# Patient Record
Sex: Male | Born: 1980 | Race: White | Hispanic: No | Marital: Married | Smoking: Current every day smoker
Health system: Southern US, Community
[De-identification: ages and names within clinical notes are randomized; demographics above are authoritative.]

## PROBLEM LIST (undated history)

## (undated) DIAGNOSIS — R569 Unspecified convulsions: Secondary | ICD-10-CM

## (undated) DIAGNOSIS — S069XAA Unspecified intracranial injury with loss of consciousness status unknown, initial encounter: Secondary | ICD-10-CM

## (undated) HISTORY — DX: Unspecified convulsions: R56.9

## (undated) HISTORY — PX: CRANIOTOMY: SHX93

---
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
## 2001-06-03 ENCOUNTER — Emergency Department (HOSPITAL_COMMUNITY): Admission: EM | Admit: 2001-06-03 | Discharge: 2001-06-03 | Payer: Self-pay | Admitting: Emergency Medicine

## 2001-06-03 ENCOUNTER — Encounter: Payer: Self-pay | Admitting: Emergency Medicine

## 2006-11-12 ENCOUNTER — Emergency Department (HOSPITAL_COMMUNITY): Admission: EM | Admit: 2006-11-12 | Discharge: 2006-11-12 | Payer: Self-pay | Admitting: Emergency Medicine

## 2008-09-07 ENCOUNTER — Emergency Department (HOSPITAL_COMMUNITY): Admission: EM | Admit: 2008-09-07 | Discharge: 2008-09-07 | Payer: Self-pay | Admitting: Family Medicine

## 2008-10-16 ENCOUNTER — Emergency Department (HOSPITAL_COMMUNITY): Admission: EM | Admit: 2008-10-16 | Discharge: 2008-10-16 | Payer: Self-pay | Admitting: Emergency Medicine

## 2010-08-19 HISTORY — PX: HAND SURGERY: SHX662

## 2010-11-18 ENCOUNTER — Observation Stay (HOSPITAL_COMMUNITY)
Admission: EM | Admit: 2010-11-18 | Discharge: 2010-11-19 | DRG: 501 | Disposition: A | Discharge status: Home / Self Care | Payer: Self-pay | Attending: Emergency Medicine | Admitting: Emergency Medicine

## 2010-11-18 ENCOUNTER — Emergency Department (HOSPITAL_COMMUNITY): Payer: Self-pay

## 2010-11-18 DIAGNOSIS — S55809A Unspecified injury of other blood vessels at forearm level, unspecified arm, initial encounter: Secondary | ICD-10-CM | POA: Insufficient documentation

## 2010-11-18 DIAGNOSIS — S5400XA Injury of ulnar nerve at forearm level, unspecified arm, initial encounter: Secondary | ICD-10-CM | POA: Insufficient documentation

## 2010-11-18 DIAGNOSIS — S66909A Unspecified injury of unspecified muscle, fascia and tendon at wrist and hand level, unspecified hand, initial encounter: Principal | ICD-10-CM | POA: Insufficient documentation

## 2010-11-18 DIAGNOSIS — Y93E9 Activity, other interior property and clothing maintenance: Secondary | ICD-10-CM | POA: Insufficient documentation

## 2010-11-18 DIAGNOSIS — W268XXA Contact with other sharp object(s), not elsewhere classified, initial encounter: Secondary | ICD-10-CM | POA: Insufficient documentation

## 2010-11-18 DIAGNOSIS — Z01812 Encounter for preprocedural laboratory examination: Secondary | ICD-10-CM | POA: Insufficient documentation

## 2010-11-18 DIAGNOSIS — F17200 Nicotine dependence, unspecified, uncomplicated: Secondary | ICD-10-CM | POA: Insufficient documentation

## 2010-11-18 DIAGNOSIS — S61509A Unspecified open wound of unspecified wrist, initial encounter: Principal | ICD-10-CM | POA: Insufficient documentation

## 2010-11-18 DIAGNOSIS — S61209A Unspecified open wound of unspecified finger without damage to nail, initial encounter: Secondary | ICD-10-CM | POA: Insufficient documentation

## 2010-11-18 DIAGNOSIS — Y92009 Unspecified place in unspecified non-institutional (private) residence as the place of occurrence of the external cause: Secondary | ICD-10-CM | POA: Insufficient documentation

## 2010-11-19 LAB — BASIC METABOLIC PANEL
BUN: 13 mg/dL (ref 6–23)
Chloride: 105 mEq/L (ref 96–112)
Glucose, Bld: 101 mg/dL — ABNORMAL HIGH (ref 70–99)
Potassium: 3.5 mEq/L (ref 3.5–5.1)

## 2010-11-19 LAB — CBC
HCT: 38.3 % — ABNORMAL LOW (ref 39.0–52.0)
Hemoglobin: 12.5 g/dL — ABNORMAL LOW (ref 13.0–17.0)
MCV: 78.8 fL (ref 78.0–100.0)
Platelets: 214 10*3/uL (ref 150–400)
RBC: 4.86 MIL/uL (ref 4.22–5.81)
WBC: 14.2 10*3/uL — ABNORMAL HIGH (ref 4.0–10.5)

## 2010-11-19 LAB — POCT I-STAT, CHEM 8
Chloride: 106 mEq/L (ref 96–112)
HCT: 40 % (ref 39.0–52.0)
Potassium: 3.6 mEq/L (ref 3.5–5.1)

## 2010-11-19 LAB — DIFFERENTIAL
Lymphocytes Relative: 18 % (ref 12–46)
Lymphs Abs: 2.6 10*3/uL (ref 0.7–4.0)
Neutro Abs: 10.5 10*3/uL — ABNORMAL HIGH (ref 1.7–7.7)
Neutrophils Relative %: 74 % (ref 43–77)

## 2010-11-19 NOTE — H&P (Signed)
Randy Pratt, Randy Pratt            ACCOUNT NO.:  [id number]  MEDICAL RECORD NO.:  [id number]           PATIENT TYPE:  E  LOCATION:  [facility]                         FACILITY:  [facility]  PHYSICIAN:  Betha Loa, MD        DATE OF BIRTH:  1981-04-25  DATE OF ADMISSION:  11/18/2010 DATE OF DISCHARGE:                             HISTORY & PHYSICAL   CHIEF COMPLAINT:  Left wrist laceration.  HISTORY OF PRESENT ILLNESS:  Randy Pratt is a 30 year old right-hand dominant white male who states that while he was pushing a bag of trash into the car approximately 4 hours ago, a piece of glass that was broken poked through the back and poked into his left wrist.  This happened approximately 9 p.m. on November 18, 2010.  He reports no previous injuries to the left arm and no other injuries at this time.  He had significant bleeding and loss of sensation on some of the hand.  He was brought to the [facility] Emergency Department for evaluation.  He was evaluated by the emergency room staff and clear of other injuries.  ALLERGIES:  No known drug allergies.  PAST MEDICAL HISTORY:  None.  PAST SURGICAL HISTORY:  None.  MEDICATIONS:  None.  FAMILY HISTORY:  Noncontributory.  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  He smokes 1 pack per day x13 years and drinks alcohol socially.  REVIEW OF SYSTEMS:  A 13-point review of systems negative.  PHYSICAL EXAMINATION:  VITAL SIGNS:  Temperature 98.0, pulse 94, respirations 20, and BP 120/76. HEAD:  Normocephalic and atraumatic. NECK:  Supple.  Full range of motion. CHEST:  Regular rate and rhythm. LUNGS:  Clear to auscultation bilaterally. ABDOMEN:  Nontender and nondistended. EXTREMITIES:  Right upper extremity is distally neurovascularly intact in radial, median, and ulnar nerve distributions.  He has intact sensation and capillary refill in all fingertips.  He can flex and extend the IP joint of his thumb and can cross his  fingers.  He has no tenderness to palpation.  No wounds.  Left upper extremity, he has an approximately 6-cm laceration at the ulnar side of the wrist.  No other lacerations are noted.  He has intact capillary refill in all fingertips.  Radial pulse is 2+.  He has intact sensation in the thumb, index, long, and radial side of the ring finger.  He has decreased sensation in the ulnar side of the ring and the small finger. He has intact sensation on the dorsum of the hand.  He has positive EPL and FPL activity.  He has intact flexion at the DIP joint of all fingers.  The small and ring fingers are actually held in a more flexed position.  He has intact FDS to the long finger.  This is painful in the long finger.  He has pain with attempts at FDS motion at the ring and small fingers.  There is significant bleeding vessel in the wounds with pulsatile bleeding.  RADIOGRAPHS:  AP and lateral views of left wrist show no fractures, dislocations, or radiopaque foreign bodies.  LABORATORY DATA:  Sodium 139,  potassium 3.6, chloride 106, BUN 15, creatinine 1.0, glucose 97, ionized calcium 1.1, and CO2 of 22. Hemoglobin 13.6 and hematocrit 40.0.  ASSESSMENT/PLAN:  Left wrist laceration with possible tendon artery and nerve involvement.  I discussed with Mr. Riesgo and his family members who were with him the nature of his injury.  I recommended going to the operating room for irrigation and debridement of the wound and repair of tendon artery and nerve lacerations.  Risks, benefits, and alternatives of surgery were discussed including risks of blood loss, infection, damage to nerves, vessels, tendons, ligaments, and bone, failure of procedure, the need for additional procedures, complications with wound healing, continued pain, stiffness, loss of range of motion, and loss of sensation.  They voiced understanding these risks and elected to proceed.  Surgical consent was signed.  We will have the  surgery arranged as soon as possible.  He was given 1 g of IV Ancef in the emergency department.  His tetanus was updated.     Betha Loa, MD     KK/MEDQ  D:  11/19/2010  T:  11/19/2010  Job:  [id number]  Electronically Signed by Betha Loa  on 11/19/2010 02:57:07 PM

## 2010-11-21 ENCOUNTER — Encounter: Payer: Self-pay | Admitting: Occupational Therapy

## 2010-11-21 LAB — POCT I-STAT 4, (NA,K, GLUC, HGB,HCT)
HCT: 35 % — ABNORMAL LOW (ref 39.0–52.0)
Hemoglobin: 11.9 g/dL — ABNORMAL LOW (ref 13.0–17.0)
Sodium: 139 mEq/L (ref 135–145)

## 2010-11-21 NOTE — Op Note (Signed)
NAMEHUE, FRICK            ACCOUNT NO.:  [id number]  MEDICAL RECORD NO.:  [id number]           PATIENT TYPE:  E  LOCATION:  [facility]                         FACILITY:  [facility]  PHYSICIAN:  Betha Loa, MD        DATE OF BIRTH:  [birth year]  DATE OF PROCEDURE:  11/19/2010 DATE OF DISCHARGE:                              OPERATIVE REPORT   PREOPERATIVE DIAGNOSIS:  Left wrist laceration with tendon, artery, and  nerve involvement.  POSTOPERATIVE DIAGNOSES:   1. Left flexor carpi ulnaris laceration 2. Left flexor digitorum superficialis to index, long, ring, and small finger    lacerations 3. Left flexor digitorum profundus to long, ring, and small finger lacerations 4. Left ulnar artery laceration 5. Left ulnar nerve laceration  PROCEDURE:   1.  Left wrist irrigation and debridement 2.  Repair left ulnar nerve laceration 3.  Repair left ulnar artery laceration 4.  Repair left Left flexor digitorum profundus to long, ring, and small finger 5.  Repair left flexor digitorum profundus to long finger 6.  Repair left flexor digitorum profundus to ring finger 7.  Repair left flexor digitorum profundus to small finger 8.  Repair left flexor digitorum superficialis to index finger 9.  Repair left flexor digitorum superficialis to long finger 10. Repair left flexor digitorum superficialis to ring finger 11. Repair left flexor digitorum superficialis to small finger 12. Repair left flexor carpi ulnaris  SURGEON:  Betha Loa, MD  ASSISTANT:  None.  ANESTHESIA:  General.  IV FLUIDS:  Per anesthesia flow sheet.  ESTIMATED BLOOD LOSS:  Less than 50 mL.  COMPLICATIONS:  None.  TOURNIQUET TIME:  138 minutes plus 44 minutes.  DISPOSITION:  Stable to PACU.  INDICATIONS:  Mr. Kohlmann is a 30 year old right-hand-dominant white male who states that the on the evening of November 18, 2010, he was pushing a trash bag into his car and a piece of glass broke through it and lacerated his  left wrist.  He was brought to [facility] Emergency Department where he was evaluated.  He was found to have arterial bleeding and tendon lacerations.  I was consulted for the management of injury.  On examination, he had intact sensation of the radial and median nerve distributions of the hand, but no sensation in the ulnar nerve distribution.  There was a bleeding arterial vessel.  He was able to flex index, long, ring, and small fingers.  He had difficulty with FDS activity to the ring, small, and long fingers.  I recommended going to the operating room for irrigation and debridement, exploration of the wound, and repair of associated injuries.  Risks, benefits, and alternatives of the surgery were discussed including the risk of blood loss, infection, damage to nerves, vessels, tendons, ligaments, bone, failure of surgery, need for additional surgery, complications with wound healing, continued pain, stiffness, and continued numbness.  He voiced understanding these risks and elected to proceed.  OPERATIVE COURSE:  After being identified preoperatively by myself, the patient and I agreed upon the procedure and site of procedure.  The surgical site was marked.  The risks, benefits, and alternatives of surgery were  reviewed and he wished to proceed.  Surgical consent had been signed. He had been given Ancef and his tetanus updated in the emergency department.  His was transported to the operating room, placed on the operating table in supine position with left upper extremity on arm board.  General anesthesia was induced by the anesthesiologist.  Left upper extremity was prepped and draped in normal sterile orthopedic fashion.  A surgical pause was performed between surgeons, Anesthesia, and operating room staff and all were in agreement as to the patient, procedure, and site of procedure.  Tourniquet on the proximal aspect of the extremity was inflated to 250 mmHg after exsanguination  of the limb with an Esmarch bandage.  The wound was explored.  It was extended both proximally and distally.  He was noted to have lacerated the FCU, the FDS to the index, long, ring, and small fingers, the FDP to the long, ring, and small fingers, the ulnar artery and ulnar nerve.  The FDP laceration to the small finger was a partial laceration.  The FDP laceration to the ring finger was approximately 90%.  The wound was copiously irrigated with 1000 mL of sterile saline.  The median nerve was identified and was intact.  The palmar cutaneous branch was intact as well.  The FPL and FDP to the index were identified, and they were intact as well.  The tendons were all repaired using 3-0 Ethibond suture in a core fashion.  Four strands were placed in all, except the FDS to the small which was too small to accept four strands.  The FDP to the long and ring were near the musculotendinous junction and therefore not a single solid  tendon.  This led to difficulty keeping the repair from tightening the tendon.  The FCU was repaired after addressing the ulnar artery and  ulnar nerve.  The microscope was brought in.  The ulnar nerve was cleared of surrounding tissue.  The fascicles were easily identified.  Epineural repair was performed with lining up of the fascicles.  There were two large bundles and one small bundle and these were aligned.  A 9-0 nylon suture was used in an interrupted circumferential fashion.  Attention was turned to the artery.  The tourniquet had to be deflated at 138 minutes.  The arterial lumen had been cleared and vessel clamps placed prior to deflating the tourniquet.  The adventitia was stripped from the cut edges.  The tourniquet was reinflated to 250 mmHg after being down for 20 minutes.  The artery was repaired using 9-0 nylon suture in an interrupted circumferential fashion.  This apposed the arterial edges well.  The wound was again copiously irrigated with sterile  saline.  The FCU was repaired using the 3-0 Ethibond suture in a core suture and oversewn with figure-of-eights. The FCU had been lacerated very distally.  It was just proximal to the pisiform.  There was enough distal tendon stump to sew into, however. The skin was closed with 3-0 nylon in a horizontal mattress fashion.   A hemoglobin/hematocrit was drawn and the patient's hematocrit was 35. The wound was dressed with sterile Xeroform and 4x4s and wrapped with Kerlix.  A dorsal blocking splint was placed with the wrist flexed approximately 30-40 degrees. the MPs flexed and the IPs extended. This was wrapped with Kerlix and Ace bandage.  The tourniquet was deflated at 44 minutes.  Fingertips were all pink with brisk capillary refill after deflation of tourniquet.  The operative drapes were  broken down.  The patient was awoken from anesthesia safely.  He was transferred back to the stretcher and taken to PACU in stable condition. Percocet 5/325 one to two p.o. q.6 h. p.r.n. pain dispensed #50 and Bactrim DS one p.o. b.i.d. x7 days.  We will see him back in the office in 1 week for postoperative followup.     Betha Loa, MD     KK/MEDQ  D:  11/19/2010  T:  11/19/2010  Job:  [id number]  Electronically Signed by Betha Loa  on 11/21/2010 10:59:37 AM

## 2010-12-04 NOTE — Discharge Summary (Signed)
  Randy Pratt, Randy Pratt            ACCOUNT NO.:  [id number]  MEDICAL RECORD NO.:  [id number]           PATIENT TYPE:  I  LOCATION:  2550                         FACILITY:  [facility]  PHYSICIAN:  Betha Loa, MD        DATE OF BIRTH:  [birth year]  DATE OF ADMISSION:  11/18/2010 DATE OF DISCHARGE:  11/19/2010                              DISCHARGE SUMMARY   FINAL DIAGNOSIS:  Left wrist laceration with tendon, artery, and nerve involvement.  OPERATIVE PROCEDURE:  Irrigation and debridement of left wrist laceration with repair of left ulnar nerve & ulnar artery; flexor digitorum profundus to long, ring, and small fingers; flexor digitorum superficialis to index, long, ring, and small fingers; and flexor carpi ulnaris repair.  HISTORY:  Randy Pratt is a 30 year old right-hand dominant white male who on the evening of November 18, 2010, was pushing trash bag into his car when a piece of glass broke through and lacerated the left wrist.  He was brought to the [facility] Emergency Department where he was evaluated.  He had arterial bleeding.  I was consulted for management of injury.  On examination, he had decreased sensation in the ulnar nerve distribution, but intact sensation in the median nerve distribution of the hand.  There was active bleeding.  I recommended to Randy Pratt going to the operating room for irrigation and debridement of the wound and repair of damaged structures.  Risks, benefits, and alternatives of surgery were discussed and wished to proceed.  HOSPITAL COURSE:  Randy Pratt was taken to the operating room in the early morning hours of November 19, 2010.  Irrigation and debridement of the wound with repair of the left ulnar nerve and artery; left flexor carpi ulnaris, left FDS to index, long, ring, and small; and FDP to long, ring, and small fingers.  This was tolerated well.  He was discharged home from PACU.  He was given Percocet 5/325 1-2 p.o. q.6 h. p.r.n.  pain dispensed #50 and Bactrim DS 1 p.o. b.i.d. x1 week with follow up in 1 week.     Betha Loa, MD     KK/MEDQ  D:  12/01/2010  T:  12/02/2010  Job:  [id number]  Electronically Signed by Betha Loa  on 12/04/2010 09:50:21 AM

## 2012-01-09 ENCOUNTER — Emergency Department (INDEPENDENT_AMBULATORY_CARE_PROVIDER_SITE_OTHER): Payer: Self-pay

## 2012-01-09 ENCOUNTER — Emergency Department (INDEPENDENT_AMBULATORY_CARE_PROVIDER_SITE_OTHER)
Admission: EM | Admit: 2012-01-09 | Discharge: 2012-01-09 | Disposition: A | Discharge status: Home / Self Care | Payer: Self-pay | Source: Home / Self Care | Attending: Emergency Medicine | Admitting: Emergency Medicine

## 2012-01-09 ENCOUNTER — Encounter (HOSPITAL_COMMUNITY): Payer: Self-pay | Admitting: Emergency Medicine

## 2012-01-09 DIAGNOSIS — S6990XA Unspecified injury of unspecified wrist, hand and finger(s), initial encounter: Secondary | ICD-10-CM

## 2012-01-09 MED ORDER — HYDROCODONE-ACETAMINOPHEN 5-500 MG PO TABS: 1.0000 | ORAL_TABLET | Freq: Four times a day (QID) | ORAL | Status: AC | PRN

## 2012-01-09 MED ORDER — IBUPROFEN 800 MG PO TABS
ORAL_TABLET | ORAL | Status: AC
  Filled 2012-01-09: qty 1

## 2012-01-09 MED ORDER — IBUPROFEN 800 MG PO TABS
800.0000 mg | ORAL_TABLET | Freq: Once | ORAL | Status: AC
  Administered 2012-01-09: 800 mg via ORAL

## 2012-01-09 NOTE — ED Provider Notes (Addendum)
History     CSN: [id number]  Arrival date & time 01/09/12  1449   First MD Initiated Contact with Patient 01/09/12 1455      Chief Complaint  Patient presents with  . Hand Pain  . Wrist Pain    (Consider location/radiation/quality/duration/timing/severity/associated sxs/prior treatment) HPI Comments: Patient here with left hand pain and difficulty fully opening his and specially when it comes to his fifth and fourth fingers. He described that he was playing yesterday with his son 57-year-old when he fell on top of his hand (palmar surface). Patient describes that he's unable to fully extend this to fingers and when he tries is very painful near the distal crease of this ulnar side of his hand. Patient started applying ice and took some aspirin for his pain. Describes a with movement expresses his pain issues from the left side of his hand all way up to his elbow.  Patient denies any, numbness, or tingling sensation to his hand or fingers  Patient is a 31 y.o. male presenting with hand pain and wrist pain. The history is provided by the patient.  Hand Pain This is a new problem. The current episode started yesterday. The problem occurs constantly. The problem has not changed since onset.Exacerbated by: movement and palpation. The symptoms are relieved by rest. He has tried nothing for the symptoms.  Wrist Pain    History reviewed. No pertinent past medical history.  Past Surgical History  Procedure Date  . Hand surgery 2012    History reviewed. No pertinent family history.  History  Substance Use Topics  . Smoking status: Current Everyday Smoker -- 0.5 packs/day  . Smokeless tobacco: Not on file  . Alcohol Use: No      Review of Systems  Constitutional: Negative for fever and appetite change.  Skin: Negative for color change, pallor and rash.  Neurological: Negative for weakness and numbness.    Allergies  Review of patient's allergies indicates no known  allergies.  Home Medications   Current Outpatient Rx  Name Route Sig Dispense Refill  . HYDROCODONE-ACETAMINOPHEN 5-500 MG PO TABS Oral Take 1-2 tablets by mouth every 6 (six) hours as needed for pain. 15 tablet 0    BP 145/81  Pulse 84  Temp(Src) 98.6 F (37 C) (Oral)  Resp 16  SpO2 100%  Physical Exam  Nursing note and vitals reviewed. Constitutional: He appears well-developed and well-nourished.  Musculoskeletal: He exhibits tenderness.       Hands: Neurological: He is alert.  Skin: No rash noted. No erythema.    ED Course  Procedures (including critical care time)  Labs Reviewed - No data to display Dg Hand Complete Left  01/09/2012  *RADIOLOGY REPORT*  Clinical Data: Wrist pain  LEFT HAND - COMPLETE 3+ VIEW  Comparison: None.  Findings: No evidence of carpal fracture or metacarpal fracture. Phalanges appear normal.  Joint spaces are maintained.  IMPRESSION: No evidence of hand  fracture. No osseous abnormality.  Original Report Authenticated By: Genevive Bi, M.D.     1. Hand injury       MDM  Have discussed case with Dr. Merlyn Lot which had performed a previous surgery for significant tendon nerve and ulnar artery damage and a accidental laceration to occur in April 2012. Patient seemed to have some type of contracture and limited range of motion of fourth and fifth digits. Exam was not suggestive of a vascular or neurological deficit. As per his recommendation we have immobilized the affected area we  will ulnar gutter splint and Dr. Merlyn Lot will see patient tomorrow. Elevation, pain management was recommended and prescribed the patient. Patient agree with treatment plan and followup care tomorrow        Jimmie Molly, MD 01/09/12 1627  Jimmie Molly, MD 01/09/12 [phone]

## 2012-01-09 NOTE — Discharge Instructions (Signed)
  Have discussed your physical exam and x-ray findings with Dr. Merlyn Lot, he will see you tomorrow please call his office early in the morning to arrange for time. Have decided to immobilize her hand and fingers, keep her hand elevated as much as possible and followup tomorrow with your hand orthopedic provider    Hand Injuries Minor fractures, sprains, bruises and burns of the hand are often managed in much the same way:  Elevation of your hand above the level of your heart for a few days after the injury, until the pain and swelling improve.   The use of hand dressings and splints to reduce motion, relieve painand prevent reinjury. The dressing and splint should not be removed without the caregiver's approval.   Application of ice packs for 20 to 30 minutes every few hours for 2 to 3 days to reduce pain and swelling due to fractures, sprains, and deep bruises.   The use of medicine to reduce pain and inflammation.  Early motion exercises are sometimes needed to reduce joint stiffness after a hand injury. However, your hand should not be used for any activities that increase pain. Document Released: 09/12/2004 Document Revised: 07/25/2011 Document Reviewed: 11/04/2008 [facility] Patient Information 2012 [place], [place].

## 2012-01-09 NOTE — ED Notes (Signed)
PT HERE WITH LEFT LATERAL HAND PAIN WITH DIFF TO OPEN HAND OR CLOSE AFTER INJURY YESTERDAY.STATES S/P SURGERY IN L AND X1 YR AGO.CHILD FELL ON HAND WHEN SX STARTED.ICE AND ASPIRIN FOR RELIEF.PAIN SHOOTS UP TO ARM

## 2012-01-09 NOTE — Progress Notes (Signed)
Orthopedic Tech Progress Note Patient Details:  Randy Pratt Oct 02, 1980 [id number]  Type of Splint: Other (comment) (ulna gutter) Splint Location: (L) UE Splint Interventions: Application    Jennye Moccasin 01/09/2012, 4:04 PM

## 2012-09-24 ENCOUNTER — Emergency Department (HOSPITAL_COMMUNITY)
Admission: EM | Admit: 2012-09-24 | Discharge: 2012-09-25 | Disposition: A | Discharge status: Other Acute Inpatient Hospital | Payer: Managed Care, Other (non HMO) | Attending: Emergency Medicine | Admitting: Emergency Medicine

## 2012-09-24 ENCOUNTER — Encounter (HOSPITAL_COMMUNITY): Payer: Self-pay | Admitting: *Deleted

## 2012-09-24 DIAGNOSIS — F4320 Adjustment disorder, unspecified: Secondary | ICD-10-CM | POA: Insufficient documentation

## 2012-09-24 DIAGNOSIS — F17200 Nicotine dependence, unspecified, uncomplicated: Secondary | ICD-10-CM | POA: Insufficient documentation

## 2012-09-24 DIAGNOSIS — Z8659 Personal history of other mental and behavioral disorders: Secondary | ICD-10-CM | POA: Insufficient documentation

## 2012-09-24 LAB — CBC WITH DIFFERENTIAL/PLATELET
Basophils Absolute: 0 10*3/uL (ref 0.0–0.1)
Basophils Relative: 0 % (ref 0–1)
Eosinophils Absolute: 0 10*3/uL (ref 0.0–0.7)
Lymphs Abs: 2.4 10*3/uL (ref 0.7–4.0)
MCH: 28.2 pg (ref 26.0–34.0)
Neutrophils Relative %: 73 % (ref 43–77)
Platelets: 293 10*3/uL (ref 150–400)
RBC: 5.28 MIL/uL (ref 4.22–5.81)

## 2012-09-24 NOTE — ED Notes (Signed)
Pt alert and oriented x 4; states he is here for observation; denies any complaints of distress; states he has not been hearing voices; states he is not having any thoughts of hurting himself or others

## 2012-09-24 NOTE — ED Notes (Signed)
Pt brought in by [facility]; IVC; believes hears voices from God telling him to be ready with his two sons to go with him; hearing voices telling him to hurt his wife if she gets close to his boys; told law enforcement that he and his sons are going to heaven; Patent examiner contacted mobile crisis; showing psychosis x 2 days

## 2012-09-25 LAB — ETHANOL: Alcohol, Ethyl (B): <11 mg/dL (ref 0–11)

## 2012-09-25 LAB — BASIC METABOLIC PANEL
GFR calc Af Amer: >90 mL/min (ref >90)
GFR calc non Af Amer: >90 mL/min (ref >90)
Glucose, Bld: 102 mg/dL — ABNORMAL HIGH (ref 70–99)
Potassium: 3.5 mEq/L (ref 3.5–5.1)
Sodium: 137 mEq/L (ref 135–145)

## 2012-09-25 LAB — RAPID URINE DRUG SCREEN, HOSP PERFORMED
Cocaine: NOT DETECTED
Opiates: NOT DETECTED

## 2012-09-25 NOTE — ED Provider Notes (Signed)
History     CSN: [id number]  Arrival date & time 09/24/12  2322   First MD Initiated Contact with Patient 09/25/12 0219      No chief complaint on file.   (Consider location/radiation/quality/duration/timing/severity/associated sxs/prior treatment) HPIBradley E Pratt is a 32 y.o. male who is brought in by the [facility] under IVC, for 4 hours told Sheriff that he is hearing voices from God telling him to be ready because she is going to heaven with his 2 sons. He would not allow his wife to get near him or his son's. Voice also allegedly told him to hurt his wife he she got close to him or the boys. Law enforcement called mobile crisis and mobile crisis filled out the IVC paperwork. Patient is had symptoms for approximately 2 days. On my interview, patient denies any delusions, hallucinations either are toward your visual, denies any psychiatric history any recent depression or anxiety. Denies any physical complaints at this time. Denies any alcohol or recent marijuana use. Patient says he did smoke some marijuana "a couple hits" a couple of days ago. 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  History reviewed. No pertinent past medical history.  Past Surgical History  Procedure Date  . Hand surgery 2012    No family history on file.  History  Substance Use Topics  . Smoking status: Current Every Day Smoker -- 0.5 packs/day  . Smokeless tobacco: Not on file  . Alcohol Use: No      Review of Systems At least 10pt or greater review of systems completed and are negative except where specified in the HPI.  Allergies  Review of patient's allergies indicates no known allergies.  Home Medications   Current Outpatient Rx  Name  Route  Sig  Dispense  Refill  . ASPIRIN-ACETAMINOPHEN-CAFFEINE 260-130-16 MG PO TABS   Oral   Take 1 Package by mouth 2 (two) times daily as needed. For pain           BP 120/72  Pulse 78  Temp 98.2 F (36.8 C) (Oral)  Resp 18  SpO2 98%  Physical  Exam  Nursing notes reviewed.  Electronic medical record reviewed. VITAL SIGNS:   Filed Vitals:   09/24/12 2337 09/25/12 0127 09/25/12 0447  BP: 141/96 123/83 120/72  Pulse: 101 80 78  Temp: 99.1 F (37.3 C) 98.2 F (36.8 C) 98.2 F (36.8 C)  TempSrc:  Oral Oral  Resp: 20 20 18   SpO2:  99% 98%   CONSTITUTIONAL: Awake, oriented, appears non-toxic HENT: Atraumatic, normocephalic, oral mucosa pink and moist, airway patent. Nares patent without drainage. External ears normal. EYES: Conjunctiva clear, EOMI, PERRLA NECK: Trachea midline, non-tender, supple CARDIOVASCULAR: Normal heart rate, Normal rhythm, No murmurs, rubs, gallops PULMONARY/CHEST: Clear to auscultation, no rhonchi, wheezes, or rales. Symmetrical breath sounds. Non-tender. ABDOMINAL: Non-distended, soft, non-tender - no rebound or guarding.  BS normal. NEUROLOGIC: Non-focal, moving all four extremities, no gross sensory or motor deficits. EXTREMITIES: No clubbing, cyanosis, or edema SKIN: Warm, Dry, No erythema, No rash Psychiatric: Slightly elevated mood for the situation, normal thought content, slightly increased rate of speech, not responding to internal stimuli, no SI or HI. ED Course  Procedures (including critical care time)  Labs Reviewed  CBC WITH DIFFERENTIAL - Abnormal; Notable for the following:    WBC 12.0 (*)     Neutro Abs 8.8 (*)     All other components within normal limits  BASIC METABOLIC PANEL - Abnormal; Notable for the following:  Glucose, Bld 102 (*)     All other components within normal limits  URINE RAPID DRUG SCREEN (HOSP PERFORMED) - Abnormal; Notable for the following:    Tetrahydrocannabinol POSITIVE (*)     All other components within normal limits  ETHANOL   No results found.   1. Adjustment disorder   2. History of psychosis       MDM  Randy Pratt is a 32 y.o. male presents under IVC from mobile crisis after sitting he heard God telling him he is going to go to  heaven with his 2 sons - patient evidently has had 2 days of psychotic-type thoughts and behavior, and was behaving erratically over the last 4 hours speaking delusionally. At this point, the patient denies any consisted does remember that happen. I believe that the patient has fair insight and is being very cagey with his replies toward me. The patient will likely need inpatient psychiatric evaluation and care. Obtain tele-psych.  Tele-psych agrees with admission for psychiatric purposes: Diagnosis of adjustment disorder, and rule out psychosis from drugs         Jones Skene, MD 09/25/12 [phone]

## 2012-09-25 NOTE — ED Notes (Signed)
One personal belonging bag  [id number]
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
## 2013-01-12 ENCOUNTER — Emergency Department (HOSPITAL_COMMUNITY)
Admission: EM | Admit: 2013-01-12 | Discharge: 2013-01-12 | Disposition: A | Discharge status: Home / Self Care | Payer: Managed Care, Other (non HMO) | Source: Home / Self Care

## 2013-01-12 ENCOUNTER — Encounter (HOSPITAL_COMMUNITY): Payer: Self-pay | Admitting: *Deleted

## 2013-01-12 DIAGNOSIS — K047 Periapical abscess without sinus: Secondary | ICD-10-CM

## 2013-01-12 MED ORDER — PENICILLIN V POTASSIUM 500 MG PO TABS: 500.0000 mg | ORAL_TABLET | Freq: Four times a day (QID) | ORAL | Status: DC

## 2013-01-12 MED ORDER — HYDROCODONE-ACETAMINOPHEN 5-325 MG PO TABS: 1.0000 | ORAL_TABLET | Freq: Four times a day (QID) | ORAL | Status: DC | PRN

## 2013-01-12 MED ORDER — HYDROCODONE-ACETAMINOPHEN 5-325 MG PO TABS
2.0000 | ORAL_TABLET | Freq: Once | ORAL | Status: AC
Start: 2013-01-12 — End: 2013-01-12
  Administered 2013-01-12: 2 via ORAL

## 2013-01-12 MED ORDER — HYDROCODONE-ACETAMINOPHEN 5-325 MG PO TABS
ORAL_TABLET | ORAL | Status: AC
  Filled 2013-01-12: qty 2

## 2013-01-12 NOTE — ED Provider Notes (Signed)
History     CSN: [id number]  Arrival date & time 01/12/13  1509   None     Chief Complaint  Patient presents with  . Dental Pain    (Consider location/radiation/quality/duration/timing/severity/associated sxs/prior treatment) HPI 32 yo M with no medical history presenting with right lower jaw pain and swelling.  He states that this started 2 days ago.  When he wakes up his right lower jaw is really swollen. It goes down with cold compresses.  Also with pain in the right lower molars, helped somewhat by salt water gargles.  He states that he has had an infected tooth before that felt the same.  He reports some cold sweats, but no fevers, n/v/d.    History reviewed. No pertinent past medical history.  Past Surgical History  Procedure Laterality Date  . Hand surgery  2012    History reviewed. No pertinent family history.  History  Substance Use Topics  . Smoking status: Current Every Day Smoker -- 0.50 packs/day  . Smokeless tobacco: Not on file  . Alcohol Use: No   Denies drug and alcohol use.  Current 1/2 ppd smoker.   Review of Systems  Constitutional: Positive for chills. Negative for fever.  HENT: Positive for dental problem. Negative for drooling, trouble swallowing and neck stiffness.   Eyes: Negative.   Respiratory: Negative.   Cardiovascular: Negative.   Gastrointestinal: Negative.   Genitourinary: Negative.   Musculoskeletal: Negative.   Skin: Negative.   Neurological: Negative.     Allergies  Review of patient's allergies indicates no known allergies.  Home Medications   Current Outpatient Rx  Name  Route  Sig  Dispense  Refill  . Aspirin-Acetaminophen-Caffeine (GOODYS EXTRA STRENGTH) 260-130-16 MG TABS   Oral   Take 1 Package by mouth 2 (two) times daily as needed. For pain           BP 136/85  Pulse 64  Temp(Src) 99.9 F (37.7 C) (Oral)  Resp 16  SpO2 100%  Physical Exam  Constitutional: He is oriented to person, place, and time. He  appears well-developed and well-nourished. No distress.  HENT:  Head: Normocephalic and atraumatic.  Mouth/Throat: Mucous membranes are not dry. Dental abscesses and dental caries present. No oropharyngeal exudate, posterior oropharyngeal edema, posterior oropharyngeal erythema or tonsillar abscesses.    No facial erythema or edema  Eyes: Conjunctivae are normal. Right eye exhibits no discharge. Left eye exhibits no discharge.  Neck: Normal range of motion. Neck supple.  Right submandibular LAD  Cardiovascular: Normal rate, regular rhythm, normal heart sounds and intact distal pulses.   No murmur heard. Pulmonary/Chest: Effort normal and breath sounds normal. No respiratory distress. He has no wheezes. He has no rales.  Neurological: He is alert and oriented to person, place, and time. He exhibits normal muscle tone.  Skin: Skin is warm and dry. No rash noted. He is not diaphoretic.  Psychiatric: He has a normal mood and affect. Thought content normal.    ED Course  Procedures (including critical care time)  Labs Reviewed - No data to display No results found.   No diagnosis found.    MDM  32 yo otherwise healthy M presenting with tooth pain.  Exam consistent with dental abscess.  No facial cellulitis or swelling currently, no systemic signs.  Will treat with PCN V 500mg  QID and norco for pain.  Discussed that he really needs to follow up with a dentist to have teeth pulled.  He expressed understanding.  Will  give norco here prior to discharge as he has a ride.        Phebe Colla, MD 01/12/13 [phone]

## 2013-01-12 NOTE — ED Notes (Signed)
Pt  Sitting  Upright on  The  Exam table   Speaking in  Complete  sentances  Reporting  Symptoms  Of a  Toothache  To  r  Lower  Molar    X   2  Days  denys  Any other  Symptoms   Ambulate d to  Exam room  In no acute  Distress

## 2013-01-13 NOTE — ED Provider Notes (Signed)
Medical screening examination/treatment/procedure(s) were performed by resident physician or non-physician practitioner and as supervising physician I was immediately available for consultation/collaboration.   Metha Kolasa DOUGLAS MD.   Geena Weinhold D Dayannara Pascal, MD 01/13/13 1442 

## 2015-01-03 ENCOUNTER — Encounter (HOSPITAL_COMMUNITY): Payer: Self-pay | Admitting: *Deleted

## 2015-01-03 ENCOUNTER — Emergency Department (INDEPENDENT_AMBULATORY_CARE_PROVIDER_SITE_OTHER)
Admission: EM | Admit: 2015-01-03 | Discharge: 2015-01-03 | Disposition: A | Discharge status: Home / Self Care | Payer: Managed Care, Other (non HMO) | Source: Home / Self Care | Attending: Family Medicine | Admitting: Family Medicine

## 2015-01-03 DIAGNOSIS — G8929 Other chronic pain: Secondary | ICD-10-CM

## 2015-01-03 DIAGNOSIS — K089 Disorder of teeth and supporting structures, unspecified: Secondary | ICD-10-CM | POA: Diagnosis not present

## 2015-01-03 MED ORDER — CLINDAMYCIN HCL 300 MG PO CAPS: 300.0000 mg | ORAL_CAPSULE | Freq: Three times a day (TID) | ORAL | Status: DC

## 2015-01-03 MED ORDER — DICLOFENAC POTASSIUM 50 MG PO TABS: 50.0000 mg | ORAL_TABLET | Freq: Three times a day (TID) | ORAL | Status: DC

## 2015-01-03 NOTE — ED Notes (Signed)
C/o R lower jaw toothache onset Saturday evening.  No dentist.  No insurance until recently. Taking Ibuprofen, salt water gargles and cold compresses.

## 2015-01-03 NOTE — ED Provider Notes (Signed)
CSN: [id number]     Arrival date & time 01/03/15  1725 History   First MD Initiated Contact with Patient 01/03/15 1905     Chief Complaint  Patient presents with  . Dental Pain   (Consider location/radiation/quality/duration/timing/severity/associated sxs/prior Treatment) Patient is a 34 y.o. male presenting with tooth pain. The history is provided by the patient.  Dental Pain Location:  Lower Lower teeth location:  29/RL 2nd bicuspid Quality:  Throbbing Severity:  Moderate Onset quality:  Gradual Duration:  4 days Progression:  Worsening Chronicity:  Chronic Context: dental caries and poor dentition   Associated symptoms: gum swelling   Associated symptoms: no facial swelling and no fever   Risk factors: lack of dental care and smoking     History reviewed. No pertinent past medical history. Past Surgical History  Procedure Laterality Date  . Hand surgery  2012    L wrist, cut 8 tendons, 1 nerve and main artery   History reviewed. No pertinent family history. History  Substance Use Topics  . Smoking status: Current Every Day Smoker -- 0.50 packs/day    Types: Cigarettes  . Smokeless tobacco: Not on file  . Alcohol Use: No    Review of Systems  Constitutional: Negative.  Negative for fever.  HENT: Positive for dental problem. Negative for facial swelling.     Allergies  Review of patient's allergies indicates no known allergies.  Home Medications   Prior to Admission medications   Medication Sig Start Date End Date Taking? Authorizing Provider  Aspirin-Acetaminophen-Caffeine (GOODYS EXTRA STRENGTH) 260-130-16 MG TABS Take 1 Package by mouth 2 (two) times daily as needed. For pain   Yes Historical Provider, MD  clindamycin (CLEOCIN) 300 MG capsule Take 1 capsule (300 mg total) by mouth 3 (three) times daily. 01/03/15   Linna HoffJames D Kindl, MD  diclofenac (CATAFLAM) 50 MG tablet Take 1 tablet (50 mg total) by mouth 3 (three) times daily. For dental pain 01/03/15   Linna HoffJames D  Kindl, MD  HYDROcodone-acetaminophen (NORCO/VICODIN) 5-325 MG per tablet Take 1-2 tablets by mouth every 6 (six) hours as needed for pain. 01/12/13   Charm RingsErin J Honig, MD  penicillin v potassium (VEETID) 500 MG tablet Take 1 tablet (500 mg total) by mouth 4 (four) times daily. 01/12/13   Charm RingsErin J Honig, MD   BP 143/89 mmHg  Pulse 65  Temp(Src) 98.1 F (36.7 C) (Oral)  Resp 16  SpO2 99% Physical Exam  Constitutional: He appears well-developed and well-nourished.  HENT:  Right Ear: External ear normal.  Left Ear: External ear normal.  Mouth/Throat: Mucous membranes are normal. Abnormal dentition. Dental caries present. No uvula swelling.    Nursing note and vitals reviewed.   ED Course  Procedures (including critical care time) Labs Review Labs Reviewed - No data to display  Imaging Review No results found.   MDM   1. Chronic dental pain        Linna HoffJames D Kindl, MD 01/03/15 2105

## 2015-06-09 ENCOUNTER — Encounter (HOSPITAL_COMMUNITY): Payer: Self-pay | Admitting: Oncology

## 2015-06-09 ENCOUNTER — Emergency Department (HOSPITAL_COMMUNITY)
Admission: EM | Admit: 2015-06-09 | Discharge: 2015-06-10 | Disposition: A | Discharge status: Home / Self Care | Payer: Managed Care, Other (non HMO) | Attending: Emergency Medicine | Admitting: Emergency Medicine

## 2015-06-09 DIAGNOSIS — S0990XA Unspecified injury of head, initial encounter: Secondary | ICD-10-CM

## 2015-06-09 DIAGNOSIS — Y9389 Activity, other specified: Secondary | ICD-10-CM | POA: Insufficient documentation

## 2015-06-09 DIAGNOSIS — S060X0A Concussion without loss of consciousness, initial encounter: Secondary | ICD-10-CM

## 2015-06-09 DIAGNOSIS — Y998 Other external cause status: Secondary | ICD-10-CM | POA: Insufficient documentation

## 2015-06-09 DIAGNOSIS — Y92410 Unspecified street and highway as the place of occurrence of the external cause: Secondary | ICD-10-CM | POA: Insufficient documentation

## 2015-06-09 DIAGNOSIS — Z720 Tobacco use: Secondary | ICD-10-CM | POA: Insufficient documentation

## 2015-06-09 DIAGNOSIS — S0993XA Unspecified injury of face, initial encounter: Secondary | ICD-10-CM | POA: Diagnosis not present

## 2015-06-09 DIAGNOSIS — Z792 Long term (current) use of antibiotics: Secondary | ICD-10-CM | POA: Diagnosis not present
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
## 2015-06-10 ENCOUNTER — Emergency Department (HOSPITAL_COMMUNITY): Payer: Managed Care, Other (non HMO)

## 2015-06-10 DIAGNOSIS — S060X0A Concussion without loss of consciousness, initial encounter: Secondary | ICD-10-CM | POA: Diagnosis not present

## 2015-06-10 MED ORDER — ONDANSETRON 4 MG PO TBDP
4.0000 mg | ORAL_TABLET | Freq: Once | ORAL | Status: AC
  Administered 2015-06-10: 4 mg via ORAL
  Filled 2015-06-10: qty 1

## 2015-06-10 MED ORDER — OXYCODONE-ACETAMINOPHEN 5-325 MG PO TABS: 1.0000 | ORAL_TABLET | Freq: Four times a day (QID) | ORAL | Status: DC | PRN

## 2015-06-10 MED ORDER — OXYCODONE-ACETAMINOPHEN 5-325 MG PO TABS
2.0000 | ORAL_TABLET | Freq: Once | ORAL | Status: AC
Start: 2015-06-10 — End: 2015-06-10
  Administered 2015-06-10: 2 via ORAL
  Filled 2015-06-10: qty 2

## 2015-06-10 MED ORDER — ONDANSETRON 4 MG PO TBDP: 4.0000 mg | ORAL_TABLET | Freq: Three times a day (TID) | ORAL | Status: DC | PRN

## 2015-06-10 NOTE — Discharge Instructions (Signed)
Concussion, Adult °A concussion is a brain injury. It is caused by: °· A hit to the head. °· A quick and sudden movement (jolt) of the head or neck. °A concussion is usually not life threatening. Even so, it can cause serious problems. If you had a concussion before, you may have concussion-like problems after a hit to your head. °HOME CARE °General Instructions °· Follow your doctor's directions carefully. °· Take medicines only as told by your doctor. °· Only take medicines your doctor says are safe. °· Do not drink alcohol until your doctor says it is okay. Alcohol and some drugs can slow down healing. They can also put you at risk for further injury. °· If you are having trouble remembering things, write them down. °· Try to do one thing at a time if you get distracted easily. For example, do not watch TV while making dinner. °· Talk to your family members or close friends when making important decisions. °· Follow up with your doctor as told. °· Watch your symptoms. Tell others to do the same. Serious problems can sometimes happen after a concussion. Older adults are more likely to have these problems. °· Tell your teachers, school nurse, school counselor, coach, athletic trainer, or work manager about your concussion. Tell them about what you can or cannot do. They should watch to see if: °¨ It gets even harder for you to pay attention or concentrate. °¨ It gets even harder for you to remember things or learn new things. °¨ You need more time than normal to finish things. °¨ You become annoyed (irritable) more than before. °¨ You are not able to deal with stress as well. °¨ You have more problems than before. °· Rest. Make sure you: °¨ Get plenty of sleep at night. °¨ Go to sleep early. °¨ Go to bed at the same time every day. Try to wake up at the same time. °¨ Rest during the day. °¨ Take naps when you feel tired. °· Limit activities where you have to think a lot or concentrate. These include: °¨ Doing  homework. °¨ Doing work related to a job. °¨ Watching TV. °¨ Using the computer. °Returning To Your Regular Activities °Return to your normal activities slowly, not all at once. You must give your body and brain enough time to heal.  °· Do not play sports or do other athletic activities until your doctor says it is okay. °· Ask your doctor when you can drive, ride a bicycle, or work other vehicles or machines. Never do these things if you feel dizzy. °· Ask your doctor about when you can return to work or school. °Preventing Another Concussion °It is very important to avoid another brain injury, especially before you have healed. In rare cases, another injury can lead to permanent brain damage, brain swelling, or death. The risk of this is greatest during the first 7-10 days after your injury. Avoid injuries by:  °· Wearing a seat belt when riding in a car. °· Not drinking too much alcohol. °· Avoiding activities that could lead to a second concussion (such as contact sports). °· Wearing a helmet when doing activities like: °¨ Biking. °¨ Skiing. °¨ Skateboarding. °¨ Skating. °· Making your home safer by: °¨ Removing things from the floor or stairways that could make you trip. °¨ Using grab bars in bathrooms and handrails by stairs. °¨ Placing non-slip mats on floors and in bathtubs. °¨ Improve lighting in dark areas. °GET HELP IF: °·   It gets even harder for you to pay attention or concentrate.  It gets even harder for you to remember things or learn new things.  You need more time than normal to finish things.  You become annoyed (irritable) more than before.  You are not able to deal with stress as well.  You have more problems than before.  You have problems keeping your balance.  You are not able to react quickly when you should. Get help if you have any of these problems for more than 2 weeks:   Lasting (chronic) headaches.  Dizziness or trouble balancing.  Feeling sick to your stomach  (nausea).  Seeing (vision) problems.  Being affected by noises or light more than normal.  Feeling sad, low, down in the dumps, blue, gloomy, or empty (depressed).  Mood changes (mood swings).  Feeling of fear or nervousness about what may happen (anxiety).  Feeling annoyed.  Memory problems.  Problems concentrating or paying attention.  Sleep problems.  Feeling tired all the time. GET HELP RIGHT AWAY IF:   You have bad headaches or your headaches get worse.  You have weakness (even if it is in one hand, leg, or part of the face).  You have loss of feeling (numbness).  You feel off balance.  You keep throwing up (vomiting).  You feel tired.  One black center of your eye (pupil) is larger than the other.  You twitch or shake violently (convulse).  Your speech is not clear (slurred).  You are more confused, easily angered (agitated), or annoyed than before.  You have more trouble resting than before.  You are unable to recognize people or places.  You have neck pain.  It is difficult to wake you up.  You have unusual behavior changes.  You pass out (lose consciousness). MAKE SURE YOU:   Understand these instructions.  Will watch your condition.  Will get help right away if you are not doing well or get worse.   This information is not intended to replace advice given to you by your health care provider. Make sure you discuss any questions you have with your health care provider.   Document Released: 07/24/2009 Document Revised: 08/26/2014 Document Reviewed: 02/25/2013 Elsevier Interactive Patient Education [employer] Take the medication as needed If you have persistent symptoms please make an appointment with the neurologist for further care
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
## 2015-06-27 ENCOUNTER — Emergency Department (HOSPITAL_COMMUNITY)
Admission: EM | Admit: 2015-06-27 | Discharge: 2015-06-27 | Disposition: A | Discharge status: Home / Self Care | Payer: Managed Care, Other (non HMO) | Attending: Emergency Medicine | Admitting: Emergency Medicine

## 2015-06-27 ENCOUNTER — Encounter (HOSPITAL_COMMUNITY): Payer: Self-pay | Admitting: Emergency Medicine

## 2015-06-27 DIAGNOSIS — F0781 Postconcussional syndrome: Secondary | ICD-10-CM | POA: Diagnosis not present

## 2015-06-27 DIAGNOSIS — R51 Headache: Secondary | ICD-10-CM | POA: Diagnosis present

## 2015-06-27 DIAGNOSIS — Z720 Tobacco use: Secondary | ICD-10-CM | POA: Insufficient documentation

## 2015-06-27 DIAGNOSIS — R42 Dizziness and giddiness: Secondary | ICD-10-CM | POA: Insufficient documentation

## 2015-06-27 MED ORDER — BUTALBITAL-APAP-CAFFEINE 50-325-40 MG PO TABS
1.0000 | ORAL_TABLET | Freq: Four times a day (QID) | ORAL | Status: AC | PRN
Start: 2015-06-27 — End: 2016-06-26
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
## 2015-06-27 NOTE — Discharge Instructions (Signed)
Post-Concussion Syndrome  Post-concussion syndrome describes the symptoms that can occur after a head injury. These symptoms can last from weeks to months.  CAUSES   It is not clear why some head injuries cause post-concussion syndrome. It can occur whether your head injury was mild or severe and whether you were wearing head protection or not.   SIGNS AND SYMPTOMS  · Memory difficulties.  · Dizziness.  · Headaches.  · Double vision or blurry vision.  · Sensitivity to light.  · Hearing difficulties.  · Depression.  · Tiredness.  · Weakness.  · Difficulty with concentration.  · Difficulty sleeping or staying asleep.  · Vomiting.  · Poor balance or instability on your feet.  · Slow reaction time.  · Difficulty learning and remembering things you have heard.  DIAGNOSIS   There is no test to determine whether you have post-concussion syndrome. Your health care provider may order an imaging scan of your brain, such as a CT scan, to check for other problems that may be causing your symptoms (such as a severe injury inside your skull).  TREATMENT   Usually, these problems disappear over time without medical care. Your health care provider may prescribe medicine to help ease your symptoms. It is important to follow up with a neurologist to evaluate your recovery and address any lingering symptoms or issues.  HOME CARE INSTRUCTIONS   · Take medicines only as directed by your health care provider. Do not take aspirin. Aspirin can slow blood clotting.  · Sleep with your head slightly elevated to help with headaches.  · Avoid any situation where there is potential for another head injury. This includes football, hockey, soccer, basketball, martial arts, downhill snow sports, and horseback riding. Your condition will get worse every time you experience a concussion. You should avoid these activities until you are evaluated by the appropriate follow-up health care providers.  · Keep all follow-up visits as directed by your health  care provider. This is important.  SEEK MEDICAL CARE IF:  · You have increased problems paying attention or concentrating.  · You have increased difficulty remembering or learning new information.  · You need more time to complete tasks or assignments than before.  · You have increased irritability or decreased ability to cope with stress.  · You have more symptoms than before.  Seek medical care if you have any of the following symptoms for more than two weeks after your injury:  · Lasting (chronic) headaches.  · Dizziness or balance problems.  · Nausea.  · Vision problems.  · Increased sensitivity to noise or light.  · Depression or mood swings.  · Anxiety or irritability.  · Memory problems.  · Difficulty concentrating or paying attention.  · Sleep problems.  · Feeling tired all the time.  SEEK IMMEDIATE MEDICAL CARE IF:  · You have confusion or unusual drowsiness.  · Others find it difficult to wake you up.  · You have nausea or persistent, forceful vomiting.  · You feel like you are moving when you are not (vertigo). Your eyes may move rapidly back and forth.  · You have convulsions or faint.  · You have severe, persistent headaches that are not relieved by medicine.  · You cannot use your arms or legs normally.  · One of your pupils is larger than the other.  · You have clear or bloody discharge from your nose or ears.  · Your problems are getting worse, not better.  MAKE   SURE YOU:  · Understand these instructions.  · Will watch your condition.  · Will get help right away if you are not doing well or get worse.     This information is not intended to replace advice given to you by your health care provider. Make sure you discuss any questions you have with your health care provider.     Document Released: 01/25/2002 Document Revised: 08/26/2014 Document Reviewed: 11/10/2013  Elsevier Interactive Patient Education ©2016 [employer]

## 2015-06-29 ENCOUNTER — Ambulatory Visit: Payer: Self-pay | Admitting: Neurology

## 2015-06-29 ENCOUNTER — Telehealth: Payer: Self-pay | Admitting: *Deleted

## 2015-06-29 NOTE — Telephone Encounter (Signed)
No showed new patient appointment. 

## 2015-07-03 ENCOUNTER — Encounter: Payer: Self-pay | Admitting: Neurology

## 2016-02-27 ENCOUNTER — Encounter (HOSPITAL_COMMUNITY): Payer: Self-pay | Admitting: Emergency Medicine

## 2016-02-27 ENCOUNTER — Ambulatory Visit (HOSPITAL_COMMUNITY)
Admission: EM | Admit: 2016-02-27 | Discharge: 2016-02-27 | Disposition: A | Discharge status: Home / Self Care | Payer: Managed Care, Other (non HMO) | Attending: Family Medicine | Admitting: Family Medicine

## 2016-02-27 DIAGNOSIS — R51 Headache: Secondary | ICD-10-CM | POA: Diagnosis not present

## 2016-02-27 DIAGNOSIS — R519 Headache, unspecified: Secondary | ICD-10-CM

## 2016-02-27 MED ORDER — METHYLPREDNISOLONE ACETATE 80 MG/ML IJ SUSP
80.0000 mg | Freq: Once | INTRAMUSCULAR | Status: AC
  Administered 2016-02-27: 80 mg via INTRAMUSCULAR

## 2016-02-27 MED ORDER — KETOROLAC TROMETHAMINE 30 MG/ML IJ SOLN
INTRAMUSCULAR | Status: AC
  Filled 2016-02-27: qty 1

## 2016-02-27 MED ORDER — ONDANSETRON 4 MG PO TBDP
ORAL_TABLET | ORAL | Status: AC
Start: 2016-02-27 — End: 2016-02-27
  Filled 2016-02-27: qty 1

## 2016-02-27 MED ORDER — AMITRIPTYLINE HCL 50 MG PO TABS: 50.0000 mg | ORAL_TABLET | Freq: Every day | ORAL | Status: DC

## 2016-02-27 MED ORDER — ONDANSETRON 4 MG PO TBDP
4.0000 mg | ORAL_TABLET | Freq: Once | ORAL | Status: AC
  Administered 2016-02-27: 4 mg via ORAL

## 2016-02-27 MED ORDER — METHYLPREDNISOLONE ACETATE 80 MG/ML IJ SUSP
INTRAMUSCULAR | Status: AC
  Filled 2016-02-27: qty 1

## 2016-02-27 MED ORDER — KETOROLAC TROMETHAMINE 30 MG/ML IJ SOLN
30.0000 mg | Freq: Once | INTRAMUSCULAR | Status: AC
  Administered 2016-02-27: 30 mg via INTRAMUSCULAR
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
## 2016-02-27 NOTE — ED Notes (Signed)
Complains of headache that started 7/10.  Patient has had intermittent headaches for 6 months to a year.  Sensitivity to light, chills, feeling feverish.

## 2016-07-02 ENCOUNTER — Ambulatory Visit (HOSPITAL_COMMUNITY)
Admission: EM | Admit: 2016-07-02 | Discharge: 2016-07-02 | Disposition: A | Discharge status: Home / Self Care | Payer: Managed Care, Other (non HMO) | Attending: Family Medicine | Admitting: Family Medicine

## 2016-07-02 ENCOUNTER — Encounter (HOSPITAL_COMMUNITY): Payer: Self-pay | Admitting: Emergency Medicine

## 2016-07-02 DIAGNOSIS — G43109 Migraine with aura, not intractable, without status migrainosus: Secondary | ICD-10-CM

## 2016-07-02 MED ORDER — METHYLPREDNISOLONE 4 MG PO TBPK: ORAL_TABLET | ORAL | 0 refills | Status: DC

## 2016-07-02 MED ORDER — ONDANSETRON 4 MG PO TBDP: 4.0000 mg | ORAL_TABLET | Freq: Once | ORAL | Status: DC

## 2016-07-02 MED ORDER — KETOROLAC TROMETHAMINE 60 MG/2ML IM SOLN: 60.0000 mg | Freq: Once | INTRAMUSCULAR | Status: DC

## 2016-07-02 MED ORDER — SUMATRIPTAN SUCCINATE 100 MG PO TABS: 100.0000 mg | ORAL_TABLET | ORAL | 0 refills | Status: DC | PRN

## 2016-07-02 NOTE — ED Provider Notes (Signed)
CSN: [id number]     Arrival date & time 07/02/16  1746 History   First MD Initiated Contact with Patient 07/02/16 1905     Chief Complaint  Patient presents with  . Headache   (Consider location/radiation/quality/duration/timing/severity/associated sxs/prior Treatment) Patient c/o headache with migraine qualities and photophonophobia.   The history is provided by the patient.  Headache  Pain location:  Generalized Quality:  Dull Radiates to:  Does not radiate Severity currently:  5/10 Severity at highest:  8/10 Onset quality:  Sudden Duration:  1 day Progression:  Waxing and waning Chronicity:  New Similar to prior headaches: yes   Context: activity   Relieved by:  Nothing Worsened by:  Nothing Ineffective treatments:  None tried   History reviewed. No pertinent past medical history. Past Surgical History:  Procedure Laterality Date  . HAND SURGERY  2012   L wrist, cut 8 tendons, 1 nerve and main artery   No family history on file. Social History  Substance Use Topics  . Smoking status: Current Every Day Smoker    Packs/day: 0.50    Types: Cigarettes  . Smokeless tobacco: Never Used  . Alcohol use No    Review of Systems  Constitutional: Negative.   HENT: Negative.   Eyes: Negative.   Respiratory: Negative.   Cardiovascular: Negative.   Gastrointestinal: Negative.   Endocrine: Negative.   Genitourinary: Negative.   Musculoskeletal: Negative.   Skin: Negative.   Allergic/Immunologic: Negative.   Neurological: Positive for headaches.  Hematological: Negative.   Psychiatric/Behavioral: Negative.     Allergies  Patient has no known allergies.  Home Medications   Prior to Admission medications   Medication Sig Start Date End Date Taking? Authorizing Provider  acetaminophen (TYLENOL) 500 MG tablet Take 1,000 mg by mouth every 6 (six) hours as needed for moderate pain.    Historical Provider, MD  amitriptyline (ELAVIL) 50 MG tablet Take 1 tablet (50 mg  total) by mouth at bedtime. 02/27/16   Linna HoffJames D Kindl, MD  Aspirin-Acetaminophen-Caffeine (GOODYS EXTRA STRENGTH) [phone] MG TABS Take 1 Package by mouth 2 (two) times daily as needed. For pain    Historical Provider, MD  clindamycin (CLEOCIN) 300 MG capsule Take 1 capsule (300 mg total) by mouth 3 (three) times daily. Patient not taking: Reported on 06/10/2015 01/03/15   Linna HoffJames D Kindl, MD  diclofenac (CATAFLAM) 50 MG tablet Take 1 tablet (50 mg total) by mouth 3 (three) times daily. For dental pain Patient not taking: Reported on 06/10/2015 01/03/15   Linna HoffJames D Kindl, MD  HYDROcodone-acetaminophen (NORCO/VICODIN) 5-325 MG per tablet Take 1-2 tablets by mouth every 6 (six) hours as needed for pain. Patient not taking: Reported on 06/27/2015 01/12/13   Charm RingsErin J Honig, MD  ibuprofen (ADVIL,MOTRIN) 200 MG tablet Take 600 mg by mouth every 6 (six) hours as needed for headache.    Historical Provider, MD  ondansetron (ZOFRAN-ODT) 4 MG disintegrating tablet Take 1 tablet (4 mg total) by mouth every 8 (eight) hours as needed for nausea or vomiting. Patient not taking: Reported on 06/27/2015 06/10/15   Earley FavorGail Schulz, NP  oxyCODONE-acetaminophen (PERCOCET/ROXICET) 5-325 MG tablet Take 1 tablet by mouth every 6 (six) hours as needed for severe pain. Patient not taking: Reported on 06/27/2015 06/10/15   Earley FavorGail Schulz, NP  penicillin v potassium (VEETID) 500 MG tablet Take 1 tablet (500 mg total) by mouth 4 (four) times daily. Patient not taking: Reported on 06/27/2015 01/12/13   Charm RingsErin J Honig, MD   Meds Ordered  and Administered this Visit  Medications - No data to display  BP 129/82 (BP Location: Right Arm)   Pulse 113   Temp 98.1 F (36.7 C) (Oral)   Resp 22   SpO2 96%  No data found.   Physical Exam  Constitutional: He is oriented to person, place, and time. He appears well-developed and well-nourished.  HENT:  Head: Normocephalic and atraumatic.  Right Ear: External ear normal.  Left Ear: External ear  normal.  Mouth/Throat: Oropharynx is clear and moist.  Eyes: Conjunctivae and EOM are normal. Pupils are equal, round, and reactive to light.  Neck: Normal range of motion. Neck supple.  Cardiovascular: Normal rate, regular rhythm and normal heart sounds.   Pulmonary/Chest: Effort normal and breath sounds normal.  Neurological: He is alert and oriented to person, place, and time.  Nursing note and vitals reviewed.   Urgent Care Course   Clinical Course     Procedures (including critical care time)  Labs Review Labs Reviewed - No data to display  Imaging Review No results found.   Visual Acuity Review  Right Eye Distance:   Left Eye Distance:   Bilateral Distance:    Right Eye Near:   Left Eye Near:    Bilateral Near:         MDM  Migraine Headache toradol 60mg  IM zofran 4mg  po now  Medrol dose pack as directed 4mg  6-5-4-3-2-1 po qd #21 Imitrex 100mg  one po with onset of HA and q 2hours prn for total of no more than 2 in one day.  #10      Deatra CanterWilliam J Carla Whilden, FNP 07/02/16 2000

## 2016-07-02 NOTE — ED Triage Notes (Signed)
Patient reports a headache that started yesterday.  Reports pain involves the entire head.  Movement makes the pain worse.  Reports a little lightheadedness.  Nausea and vomiting at times noted.

## 2016-10-29 ENCOUNTER — Ambulatory Visit (HOSPITAL_COMMUNITY)
Admission: EM | Admit: 2016-10-29 | Discharge: 2016-10-29 | Disposition: A | Discharge status: Home / Self Care | Payer: Managed Care, Other (non HMO) | Attending: Internal Medicine | Admitting: Internal Medicine

## 2016-10-29 ENCOUNTER — Encounter (HOSPITAL_COMMUNITY): Payer: Self-pay | Admitting: Family Medicine

## 2016-10-29 DIAGNOSIS — J111 Influenza due to unidentified influenza virus with other respiratory manifestations: Secondary | ICD-10-CM

## 2016-10-29 DIAGNOSIS — R69 Illness, unspecified: Secondary | ICD-10-CM

## 2016-10-29 MED ORDER — PREDNISONE 50 MG PO TABS: ORAL_TABLET | ORAL | 0 refills | Status: DC

## 2016-10-29 MED ORDER — ALBUTEROL SULFATE HFA 108 (90 BASE) MCG/ACT IN AERS: 1.0000 | INHALATION_SPRAY | Freq: Four times a day (QID) | RESPIRATORY_TRACT | 0 refills | Status: DC | PRN

## 2016-10-29 NOTE — ED Provider Notes (Signed)
CSN: [id number]     Arrival date & time 10/29/16  [id number] History   None    Chief Complaint  Patient presents with  . Fever  . Generalized Body Aches   (Consider location/radiation/quality/duration/timing/severity/associated sxs/prior Treatment) 24 hour history of URI symptoms   The history is provided by the patient.  URI  Presenting symptoms: congestion, cough, fatigue, fever and rhinorrhea   Presenting symptoms: no sore throat   Severity:  Moderate Onset quality:  Gradual Duration:  1 day Timing:  Constant Progression:  Worsening Chronicity:  New Relieved by:  Nothing Worsened by:  Nothing Ineffective treatments:  None tried Associated symptoms: headaches, myalgias, sinus pain, sneezing and wheezing   Associated symptoms: no neck pain and no swollen glands   Risk factors: not elderly, no chronic cardiac disease, no chronic kidney disease, no chronic respiratory disease, no diabetes mellitus, no immunosuppression, no recent illness, no recent travel and no sick contacts     History reviewed. No pertinent past medical history. Past Surgical History:  Procedure Laterality Date  . HAND SURGERY  2012   L wrist, cut 8 tendons, 1 nerve and main artery   History reviewed. No pertinent family history. Social History  Substance Use Topics  . Smoking status: Current Every Day Smoker    Packs/day: 0.50    Types: Cigarettes  . Smokeless tobacco: Never Used  . Alcohol use No    Review of Systems  Constitutional: Positive for fatigue and fever.  HENT: Positive for congestion, rhinorrhea, sinus pain and sneezing. Negative for sore throat.   Eyes: Negative.   Respiratory: Positive for cough and wheezing.   Gastrointestinal: Negative for abdominal distention, abdominal pain, diarrhea, nausea and vomiting.  Genitourinary: Negative.   Musculoskeletal: Positive for myalgias. Negative for neck pain.  Neurological: Positive for headaches.  All other systems reviewed and are  negative.   Allergies  Patient has no known allergies.  Home Medications   Prior to Admission medications   Medication Sig Start Date End Date Taking? Authorizing Provider  acetaminophen (TYLENOL) 500 MG tablet Take 1,000 mg by mouth every 6 (six) hours as needed for moderate pain.    Historical Provider, MD  albuterol (PROVENTIL HFA;VENTOLIN HFA) 108 (90 Base) MCG/ACT inhaler Inhale 1-2 puffs into the lungs every 6 (six) hours as needed for wheezing or shortness of breath. 10/29/16   Dorena BodoLawrence Almus Woodham, NP  amitriptyline (ELAVIL) 50 MG tablet Take 1 tablet (50 mg total) by mouth at bedtime. 02/27/16   Linna HoffJames D Kindl, MD  Aspirin-Acetaminophen-Caffeine (GOODYS EXTRA STRENGTH) [phone] MG TABS Take 1 Package by mouth 2 (two) times daily as needed. For pain    Historical Provider, MD  ibuprofen (ADVIL,MOTRIN) 200 MG tablet Take 600 mg by mouth every 6 (six) hours as needed for headache.    Historical Provider, MD  predniSONE (DELTASONE) 50 MG tablet Take one tablet daily with food 10/29/16   Dorena BodoLawrence Emilie Carp, NP   Meds Ordered and Administered this Visit  Medications - No data to display  BP 126/82   Pulse 82   Temp 98.5 F (36.9 C)   Resp 16   SpO2 100%  No data found.   Physical Exam  Constitutional: He is oriented to person, place, and time. He appears well-developed and well-nourished. No distress.  HENT:  Head: Normocephalic and atraumatic.  Right Ear: Tympanic membrane and external ear normal.  Left Ear: Tympanic membrane and external ear normal.  Nose: Nose normal. Right sinus exhibits no maxillary sinus tenderness and  no frontal sinus tenderness. Left sinus exhibits no maxillary sinus tenderness and no frontal sinus tenderness.  Mouth/Throat: Uvula is midline and oropharynx is clear and moist. No oropharyngeal exudate.  Eyes: Pupils are equal, round, and reactive to light.  Neck: Normal range of motion. Neck supple. No JVD present.  Cardiovascular: Normal rate and regular  rhythm.   Pulmonary/Chest: Effort normal. No respiratory distress. He has wheezes. He has no rhonchi. He has no rales.  Abdominal: Soft. Bowel sounds are normal.  Lymphadenopathy:    He has no cervical adenopathy.  Neurological: He is alert and oriented to person, place, and time.  Skin: Skin is warm and dry. Capillary refill takes less than 2 seconds. No rash noted. He is not diaphoretic. No erythema.  Psychiatric: He has a normal mood and affect. His behavior is normal.  Nursing note and vitals reviewed.   Urgent Care Course     Procedures (including critical care time)  Labs Review Labs Reviewed - No data to display  Imaging Review No results found.     MDM   1. Influenza-like illness    You most likely have a viral URI like influenza or an influenza-like virus, I advise rest, plenty of fluids and management of symptoms with over the counter medicines. For symptoms you may take Tylenol as needed every 4-6 hours for body aches or fever, not to exceed 4,000 mg a day, Take mucinex or mucinex DM ever 12 hours with a full glass of water, you may use an inhaled steroid such as Flonase, 2 sprays each nostril once a day for congestion, or an antihistamine such as Claritin or Zyrtec once a day.   For your wheezing, prescribed prednisone, take one tablet daily with food, I have also prescribed an albuterol inhaler, take 1-2 puffs every 4-6 hours as needed for wheezing or shortness of breath.  Should your symptoms worsen or fail to resolve, follow up with your primary care provider or return to clinic.       Dorena Bodo, NP 10/29/16 2031

## 2016-10-29 NOTE — ED Triage Notes (Signed)
Pt here for sore throat, fever and body aches.

## 2016-10-29 NOTE — Discharge Instructions (Signed)
You most likely have a viral URI like influenza or an influenza-like virus, I advise rest, plenty of fluids and management of symptoms with over the counter medicines. For symptoms you may take Tylenol as needed every 4-6 hours for body aches or fever, not to exceed 4,000 mg a day, Take mucinex or mucinex DM ever 12 hours with a full glass of water, you may use an inhaled steroid such as Flonase, 2 sprays each nostril once a day for congestion, or an antihistamine such as Claritin or Zyrtec once a day.   For your wheezing, prescribed prednisone, take one tablet daily with food, I have also prescribed an albuterol inhaler, take 1-2 puffs every 4-6 hours as needed for wheezing or shortness of breath.  Should your symptoms worsen or fail to resolve, follow up with your primary care provider or return to clinic.

## 2017-03-17 ENCOUNTER — Encounter (HOSPITAL_COMMUNITY): Payer: Self-pay | Admitting: Emergency Medicine

## 2017-03-17 ENCOUNTER — Ambulatory Visit (INDEPENDENT_AMBULATORY_CARE_PROVIDER_SITE_OTHER): Payer: Managed Care, Other (non HMO)

## 2017-03-17 ENCOUNTER — Ambulatory Visit (HOSPITAL_COMMUNITY)
Admission: EM | Admit: 2017-03-17 | Discharge: 2017-03-17 | Disposition: A | Discharge status: Home / Self Care | Payer: Managed Care, Other (non HMO) | Attending: Family Medicine | Admitting: Family Medicine

## 2017-03-17 DIAGNOSIS — S9001XA Contusion of right ankle, initial encounter: Secondary | ICD-10-CM | POA: Diagnosis not present

## 2017-03-17 DIAGNOSIS — S9031XA Contusion of right foot, initial encounter: Secondary | ICD-10-CM

## 2017-03-17 DIAGNOSIS — M79604 Pain in right leg: Secondary | ICD-10-CM | POA: Diagnosis not present

## 2017-03-17 MED ORDER — NAPROXEN 500 MG PO TABS
500.0000 mg | ORAL_TABLET | Freq: Two times a day (BID) | ORAL | 0 refills | Status: DC
Start: 2017-03-17 — End: 2018-01-22

## 2017-03-17 NOTE — Discharge Instructions (Signed)
There are fortunately no fractures on x-ray of foot or ankle   Please keep on ACE wrap for support and to reduce pain and swelling Use crutches to for next 7-10 days

## 2017-03-17 NOTE — ED Provider Notes (Signed)
[facility]    CSN: [id number] Arrival date & time: 03/17/17  1929     History   Chief Complaint Chief Complaint  Patient presents with  . Foot Pain    HPI Valentina ShaggyBrad Nilson is a 36 y.o. male.   HPI 36 year old male presents with complaint of right foot and ankle pain following fall that occurred earlier today. He reports his dog's leash wrapped around his ankle and he was pulled into flower bush falling onto his right foot and ankle. He developed pain and swelling immediately. He denied cough or pain. He reports lateral foot and ankle pain with weightbearing.  History reviewed. No pertinent past medical history.  There are no active problems to display for this patient.   Past Surgical History:  Procedure Laterality Date  . HAND SURGERY  2012   L wrist, cut 8 tendons, 1 nerve and main artery       Home Medications    Prior to Admission medications   Medication Sig Start Date End Date Taking? Authorizing Provider  naproxen (NAPROSYN) 500 MG tablet Take 1 tablet (500 mg total) by mouth 2 (two) times daily. 03/17/17   Dessa PhiFunches, Alonza Knisley, MD    Family History History reviewed. No pertinent family history.  Social History Social History  Substance Use Topics  . Smoking status: Current Every Day Smoker    Packs/day: 0.50    Types: Cigarettes  . Smokeless tobacco: Never Used  . Alcohol use No     Allergies   Patient has no known allergies.   Review of Systems Review of Systems  Constitutional: Negative for chills, fatigue, fever and unexpected weight change.  Eyes: Negative for visual disturbance.  Respiratory: Negative for cough and shortness of breath.   Cardiovascular: Negative for chest pain, palpitations and leg swelling.  Gastrointestinal: Negative for abdominal pain, blood in stool, constipation, diarrhea, nausea and vomiting.  Endocrine: Negative for polydipsia, polyphagia and polyuria.  Musculoskeletal: Positive for arthralgias, gait problem  and joint swelling. Negative for back pain, myalgias and neck pain.  Skin: Positive for color change. Negative for rash.  Allergic/Immunologic: Negative for immunocompromised state.  Hematological: Negative for adenopathy. Does not bruise/bleed easily.  Psychiatric/Behavioral: Negative for dysphoric mood, sleep disturbance and suicidal ideas. The patient is not nervous/anxious.      Physical Exam Triage Vital Signs ED Triage Vitals [03/17/17 1953]  Enc Vitals Group     BP (!) 145/107     Pulse Rate 88     Resp 18     Temp 98.4 F (36.9 C)     Temp Source Oral     SpO2 98 %     Weight      Height      Head Circumference      Peak Flow      Pain Score 7     Pain Loc      Pain Edu?      Excl. in GC?    No data found.   Updated Vital Signs BP (!) 145/107 (BP Location: Left Arm)   Pulse 88   Temp 98.4 F (36.9 C) (Oral)   Resp 18   SpO2 98%   Visual Acuity Right Eye Distance:   Left Eye Distance:   Bilateral Distance:    Right Eye Near:   Left Eye Near:    Bilateral Near:     Physical Exam  Constitutional: He appears well-developed and well-nourished. No distress.  HENT:  Head: Normocephalic and atraumatic.  Neck: Normal range of motion. Neck supple.  Cardiovascular: Normal rate, regular rhythm, normal heart sounds and intact distal pulses.   Pulmonary/Chest: Effort normal and breath sounds normal.  Musculoskeletal: He exhibits no edema.       Right ankle: He exhibits decreased range of motion and swelling. He exhibits normal pulse. Tenderness. Lateral malleolus tenderness found.       Legs:      Right foot: There is decreased range of motion, tenderness, bony tenderness and swelling. There is normal capillary refill, no crepitus, no deformity and no laceration.       Feet:  Neurological: He is alert.  Skin: Skin is warm and dry. No rash noted. No erythema.  Psychiatric: He has a normal mood and affect.     UC Treatments / Results  Labs (all labs  ordered are listed, but only abnormal results are displayed) Labs Reviewed - No data to display  EKG  EKG Interpretation None       Radiology Dg Tibia/fibula Right  Result Date: 03/17/2017 CLINICAL DATA:  Fall with injury.  Pain and swelling. EXAM: RIGHT TIBIA AND FIBULA - 2 VIEW COMPARISON:  None. FINDINGS: There is no evidence of fracture or other focal bone lesions. Soft tissues are unremarkable. IMPRESSION: Negative. Electronically Signed   By: Kennith CenterEric  Mansell M.D.   On: 03/17/2017 20:27   Dg Ankle Complete Right  Result Date: 03/17/2017 CLINICAL DATA:  Ankle injury with pain and swelling. EXAM: RIGHT ANKLE - COMPLETE 3+ VIEW COMPARISON:  None. FINDINGS: There is no evidence of fracture, dislocation, or joint effusion. There is no evidence of arthropathy or other focal bone abnormality. Soft tissues are unremarkable. IMPRESSION: Negative. Electronically Signed   By: Kennith CenterEric  Mansell M.D.   On: 03/17/2017 20:26   Dg Foot Complete Right  Result Date: 03/17/2017 CLINICAL DATA:  Fall.  Pain and swelling. EXAM: RIGHT FOOT COMPLETE - 3+ VIEW COMPARISON:  None. FINDINGS: Apparent small amount of bony callus associated with the mid shaft of the middle toe proximal phalanx suggests old injury. No evidence for an acute fracture on the current exam. No subluxation or dislocation evident. IMPRESSION: Negative. Electronically Signed   By: Kennith CenterEric  Mansell M.D.   On: 03/17/2017 20:26    Procedures Procedures (including critical care time)  Medications Ordered in UC Medications - No data to display   Initial Impression / Assessment and Plan / UC Course  I have reviewed the triage vital signs and the nursing notes.  Pertinent labs & imaging results that were available during my care of the patient were reviewed by me and considered in my medical decision making (see chart for details).     Final Clinical Impressions(s) / UC Diagnoses   Final diagnoses:  Contusion of right ankle, initial  encounter  Contusion of right foot, initial encounter   Contusion of right lateral ankle and foot following a trip and fall. There are no fractures or dislocation..  Ace bandage applied. Patient provided with crutches. Advised to rest, ice, compress and elevate. Naproxen prescribed for pain. New Prescriptions Discharge Medication List as of 03/17/2017  8:57 PM    START taking these medications   Details  naproxen (NAPROSYN) 500 MG tablet Take 1 tablet (500 mg total) by mouth 2 (two) times daily., Starting Mon 03/17/2017, Normal         Dessa PhiFunches, Shantal Roan, MD 03/17/17 2153

## 2017-03-17 NOTE — ED Triage Notes (Signed)
[facility] a [facility] pain to his right foot and ankle secondary to a fall that occurred earlier today.

## 2017-03-18 ENCOUNTER — Ambulatory Visit (HOSPITAL_COMMUNITY)
Admission: EM | Admit: 2017-03-18 | Discharge: 2017-03-18 | Disposition: A | Discharge status: Home / Self Care | Payer: Managed Care, Other (non HMO) | Attending: Physician Assistant | Admitting: Physician Assistant

## 2017-03-18 ENCOUNTER — Encounter (HOSPITAL_COMMUNITY): Payer: Self-pay | Admitting: Emergency Medicine

## 2017-03-18 DIAGNOSIS — M79661 Pain in right lower leg: Secondary | ICD-10-CM

## 2017-03-18 MED ORDER — TRAMADOL-ACETAMINOPHEN 37.5-325 MG PO TABS: 1.0000 | ORAL_TABLET | Freq: Four times a day (QID) | ORAL | 0 refills | Status: DC | PRN

## 2017-03-18 NOTE — ED Notes (Signed)
Bed: ZO10C04 Expected date: 03/18/17 Expected time: 6:30 PM Means of arrival:  Comments:

## 2017-03-18 NOTE — ED Notes (Signed)
Verified prescription medications as written, no changes

## 2017-03-18 NOTE — ED Triage Notes (Signed)
PT was seen yesterday for a right foot injury. PT here for follow up due to pain control not being effective.

## 2017-03-18 NOTE — Discharge Instructions (Addendum)
Continue taking Naprosyn along with Ultracet.  Call the orthopedist tomorrow.  Do not mix ultracet with alcohol or benzodiazepines.

## 2017-03-18 NOTE — ED Provider Notes (Signed)
03/18/2017 7:18 PM   DOB: [birth year] / MRN: [id number]  SUBJECTIVE:  Randy Pratt is a 36 y.o. male presenting for foot pain that started after a fall that has been already been worked up and negative for fracture.  Tells me he was here yesterday and imaged were negative.  Feels that the naprosyn is not really helping him.    He has No Known Allergies.   He  has no past medical history on file.    He  reports that he has been smoking Cigarettes.  He has been smoking about 0.50 packs per day. He has never used smokeless tobacco. He reports that he does not drink alcohol or use drugs. He  reports that he currently engages in sexual activity. He reports using the following method of birth control/protection: Condom. The patient  has a past surgical history that includes Hand surgery (2012).  His family history is not on file.  Review of Systems  Musculoskeletal: Positive for falls and joint pain. Negative for back pain, myalgias and neck pain.    OBJECTIVE:  BP (!) 145/80   Pulse 88   Temp 98.1 F (36.7 C) (Oral)   Resp 16   Ht 6' (1.829 m)   Wt 175 lb (79.4 kg)   SpO2 96%   BMI 23.73 kg/m   Physical Exam  Constitutional: He is oriented to person, place, and time.  Cardiovascular: Normal rate.   Pulmonary/Chest: Effort normal.  Musculoskeletal: He exhibits tenderness (Diffusely about the foot and ankle mortise.  TTP out of proportion to swelling.  DP and PT  2+. The limb is warm. There are multiple unifected scratches about the leg.  ).  Neurological: He is alert and oriented to person, place, and time. No cranial nerve deficit.    No results found for this or any previous visit (from the past 72 hour(s)).  Dg Tibia/fibula Right  Result Date: 03/17/2017 CLINICAL DATA:  Fall with injury.  Pain and swelling. EXAM: RIGHT TIBIA AND FIBULA - 2 VIEW COMPARISON:  None. FINDINGS: There is no evidence of fracture or other focal bone lesions. Soft tissues are unremarkable. IMPRESSION:  Negative. Electronically Signed   By: Kennith CenterEric  Mansell M.D.   On: 03/17/2017 20:27   Dg Ankle Complete Right  Result Date: 03/17/2017 CLINICAL DATA:  Ankle injury with pain and swelling. EXAM: RIGHT ANKLE - COMPLETE 3+ VIEW COMPARISON:  None. FINDINGS: There is no evidence of fracture, dislocation, or joint effusion. There is no evidence of arthropathy or other focal bone abnormality. Soft tissues are unremarkable. IMPRESSION: Negative. Electronically Signed   By: Kennith CenterEric  Mansell M.D.   On: 03/17/2017 20:26   Dg Foot Complete Right  Result Date: 03/17/2017 CLINICAL DATA:  Fall.  Pain and swelling. EXAM: RIGHT FOOT COMPLETE - 3+ VIEW COMPARISON:  None. FINDINGS: Apparent small amount of bony callus associated with the mid shaft of the middle toe proximal phalanx suggests old injury. No evidence for an acute fracture on the current exam. No subluxation or dislocation evident. IMPRESSION: Negative. Electronically Signed   By: Kennith CenterEric  Mansell M.D.   On: 03/17/2017 20:26    ASSESSMENT AND PLAN:  The encounter diagnosis was Pain of right lower leg. See my exam.  No evidence of compartment syndrome or ischemia of the limb. Will refer to ortho as, as best I can tell, he seems to have a severe ankle sprain.     The patient is advised to call or return to clinic if he does not  see an improvement in symptoms, or to seek the care of the closest emergency department if he worsens with the above plan.   Deliah BostonMichael Maykayla Highley, MHS, PA-C 03/18/2017 7:18 PM   Ofilia Neaslark, Zenab Gronewold L, PA-C 03/18/17 1929

## 2017-03-24 ENCOUNTER — Other Ambulatory Visit (HOSPITAL_COMMUNITY): Payer: Self-pay | Admitting: Physician Assistant

## 2017-03-25 ENCOUNTER — Other Ambulatory Visit (HOSPITAL_COMMUNITY): Payer: Self-pay | Admitting: Physician Assistant

## 2017-05-21 ENCOUNTER — Emergency Department (HOSPITAL_COMMUNITY)
Admission: EM | Admit: 2017-05-21 | Discharge: 2017-05-21 | Disposition: A | Discharge status: Home / Self Care | Payer: Managed Care, Other (non HMO) | Attending: Emergency Medicine | Admitting: Emergency Medicine

## 2017-05-21 ENCOUNTER — Encounter (HOSPITAL_COMMUNITY): Payer: Self-pay

## 2017-05-21 DIAGNOSIS — K0889 Other specified disorders of teeth and supporting structures: Secondary | ICD-10-CM | POA: Insufficient documentation

## 2017-05-21 DIAGNOSIS — Z7722 Contact with and (suspected) exposure to environmental tobacco smoke (acute) (chronic): Secondary | ICD-10-CM | POA: Insufficient documentation

## 2017-05-21 MED ORDER — NAPROXEN 500 MG PO TABS: 500.0000 mg | ORAL_TABLET | Freq: Two times a day (BID) | ORAL | 0 refills | Status: DC

## 2017-05-21 MED ORDER — PENICILLIN V POTASSIUM 500 MG PO TABS: 500.0000 mg | ORAL_TABLET | Freq: Four times a day (QID) | ORAL | 0 refills | Status: AC

## 2017-05-21 MED ORDER — NAPROXEN 500 MG PO TABS
500.0000 mg | ORAL_TABLET | Freq: Once | ORAL | Status: AC
  Administered 2017-05-21: 500 mg via ORAL
  Filled 2017-05-21: qty 1

## 2017-05-21 MED ORDER — HYDROCODONE-ACETAMINOPHEN 5-325 MG PO TABS
1.0000 | ORAL_TABLET | Freq: Once | ORAL | Status: AC
Start: 2017-05-21 — End: 2017-05-21
  Administered 2017-05-21: 1 via ORAL
  Filled 2017-05-21: qty 1

## 2017-05-21 NOTE — ED Provider Notes (Signed)
[facility] Note   CSN: [id number] Arrival date & time: 05/21/17  1918     History   Chief Complaint Chief Complaint  Patient presents with  . Dental Pain    HPI Randy Pratt is a 36 y.o. male.  HPI  Randy Pratt is a 36 year old male with no significant past medical history who presents to the emergency department with left lower dental pain. Patient states that pain has been going on for several weeks, but acutely worsened this afternoon. He states that pain is 10/10 in severity, constant and "throbbing" in nature. Pain is worsened with trying to chew on the left side of the mouth. He states that he has not gone to see a dentist in several years. Denies fever, difficulty opening the mouth, facial swelling, lesion, voice change, trouble swallowing, redness in the mouth.  History reviewed. No pertinent past medical history.  There are no active problems to display for this patient.   Past Surgical History:  Procedure Laterality Date  . HAND SURGERY  2012   L wrist, cut 8 tendons, 1 nerve and main artery       Home Medications    Prior to Admission medications   Medication Sig Start Date End Date Taking? Authorizing Provider  naproxen (NAPROSYN) 500 MG tablet Take 1 tablet (500 mg total) by mouth 2 (two) times daily. 03/17/17   Funches, Gerilyn Nestle, MD  naproxen (NAPROSYN) 500 MG tablet Take 1 tablet (500 mg total) by mouth 2 (two) times daily. 05/21/17   Kellie Shropshire, PA-C  penicillin v potassium (VEETID) 500 MG tablet Take 1 tablet (500 mg total) by mouth 4 (four) times daily. 05/21/17 05/28/17  Kellie Shropshire, PA-C  traMADol-acetaminophen (ULTRACET) 37.5-325 MG tablet Take 1-2 tablets by mouth every 6 (six) hours as needed for severe pain. 03/18/17   Ofilia Neas, PA-C    Family History History reviewed. No pertinent family history.  Social History Social History  Substance Use Topics  . Smoking status: Current Every Day Smoker   Packs/day: 0.50    Types: Cigarettes  . Smokeless tobacco: Never Used  . Alcohol use No     Allergies   Patient has no known allergies.   Review of Systems Review of Systems  Constitutional: Negative for chills and fever.  HENT: Positive for dental problem. Negative for ear pain, facial swelling, mouth sores, rhinorrhea, sore throat and trouble swallowing.   Respiratory: Negative for shortness of breath.   Skin: Negative for color change.  Neurological: Negative for headaches.     Physical Exam Updated Vital Signs BP 134/85 (BP Location: Left Arm)   Pulse 77   Temp 97.7 F (36.5 C) (Oral)   Resp 18   Ht  (1.854 m)   Wt 77.3 kg (170 lb 8 oz)   SpO2 98%   BMI 22.49 kg/m   Physical Exam  Constitutional: He appears well-developed and well-nourished.  Patient holding the left side of his jaw, appears to be in pain.  HENT:  Right Ear: External ear normal.  Left Ear: External ear normal.  Mouth/Throat: Oropharynx is clear and moist.    Dental cavities and poor oral dentition noted. Pain along tooth as depicted in image. No abscess noted. Midline uvula. No trismus. OP moist and clear. No oropharyngeal erythema or edema. No tenderness or erythema noted below the tongue. Neck supple with no tenderness. No facial edema.   Eyes: Pupils are equal, [id number] reactive to light. Conjunctivae are normal.  Right eye exhibits no discharge. Left eye exhibits no discharge.  Neck: Normal range of motion. Neck supple.  No torticollis.   Lymphadenopathy:    He has no cervical adenopathy.  Nursing note and vitals reviewed.    ED Treatments / Results  Labs (all labs ordered are listed, but only abnormal results are displayed) Labs Reviewed - No data to display  EKG  EKG Interpretation None       Radiology No results found.  Procedures Procedures (including critical care time)  Medications Ordered in ED Medications  HYDROcodone-acetaminophen (NORCO/VICODIN) 5-325  MG per tablet 1 tablet (1 tablet Oral Given 05/21/17 2208)  naproxen (NAPROSYN) tablet 500 mg (500 mg Oral Given 05/21/17 2208)     Initial Impression / Assessment and Plan / ED Course  I have reviewed the triage vital signs and the nursing notes.  Pertinent labs & imaging results that were available during my care of the patient were reviewed by me and considered in my medical decision making (see chart for details).     Patient with toothache.  No gross abscess.  Exam unconcerning for Ludwig's angina or spread of infection.  Will treat with penicillin and anti-inflammatories medicine.  Urged patient to follow-up with dentist.    Final Clinical Impressions(s) / ED Diagnoses   Final diagnoses:  Pain, dental    New Prescriptions New Prescriptions   NAPROXEN (NAPROSYN) 500 MG TABLET    Take 1 tablet (500 mg total) by mouth 2 (two) times daily.   PENICILLIN V POTASSIUM (VEETID) 500 MG TABLET    Take 1 tablet (500 mg total) by mouth 4 (four) times daily.     Kellie Shropshire, PA-C 05/21/17 2220    Arby Barrette, MD 05/22/17 [phone]

## 2017-05-21 NOTE — ED Triage Notes (Signed)
Pt complains of a dental abscess that has been bothering him for several weeks but constant pain today

## 2017-05-21 NOTE — Discharge Instructions (Signed)
Please call Dr.Koelling (our dentist on call) within the next 48 hours to schedule an appointment to evaluate your dental pain. I have also given you a resource guide for dentists in the area if you choose to use another dentist .Please call schedule an appointment as soon as possible.   I have written a prescription for an antibiotic. Please take penicillin 4 times a day for the next 7 days. I have also written a prescription for a pain medicine called Naprosyn. Please take this twice a day as needed for pain.  Please return to the emergency department if you develop fever, facial swelling, if you are unable to open your mouth. Also return for any new or worsening symptoms.

## 2017-10-23 ENCOUNTER — Other Ambulatory Visit: Payer: Self-pay

## 2017-10-23 ENCOUNTER — Ambulatory Visit (HOSPITAL_COMMUNITY)
Admission: EM | Admit: 2017-10-23 | Discharge: 2017-10-23 | Disposition: A | Discharge status: Home / Self Care | Payer: Managed Care, Other (non HMO) | Attending: Family Medicine | Admitting: Family Medicine

## 2017-10-23 ENCOUNTER — Encounter (HOSPITAL_COMMUNITY): Payer: Self-pay | Admitting: Emergency Medicine

## 2017-10-23 DIAGNOSIS — G43001 Migraine without aura, not intractable, with status migrainosus: Secondary | ICD-10-CM

## 2017-10-23 MED ORDER — KETOROLAC TROMETHAMINE 60 MG/2ML IM SOLN
60.0000 mg | Freq: Once | INTRAMUSCULAR | Status: AC
  Administered 2017-10-23: 60 mg via INTRAMUSCULAR

## 2017-10-23 MED ORDER — KETOROLAC TROMETHAMINE 60 MG/2ML IM SOLN
INTRAMUSCULAR | Status: AC
  Filled 2017-10-23: qty 2

## 2017-10-23 MED ORDER — TOPIRAMATE 50 MG PO TABS: 50.0000 mg | ORAL_TABLET | Freq: Every day | ORAL | 2 refills | Status: DC

## 2017-10-23 MED ORDER — PREDNISONE 20 MG PO TABS: ORAL_TABLET | ORAL | 0 refills | Status: DC
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
## 2017-10-23 NOTE — ED Triage Notes (Signed)
Headache started 6:30 am.  Patient does not have a pcp.  Pain all over head, but the most painful area involves eyes and forehead.  Light sensitive and some nausea and vomited x 2 times

## 2017-12-25 ENCOUNTER — Encounter (HOSPITAL_COMMUNITY): Payer: Self-pay | Admitting: Emergency Medicine

## 2017-12-25 ENCOUNTER — Ambulatory Visit (HOSPITAL_COMMUNITY)
Admission: EM | Admit: 2017-12-25 | Discharge: 2017-12-25 | Disposition: A | Discharge status: Home / Self Care | Payer: 59 | Attending: Family Medicine | Admitting: Family Medicine

## 2017-12-25 DIAGNOSIS — R197 Diarrhea, unspecified: Secondary | ICD-10-CM

## 2017-12-25 DIAGNOSIS — R112 Nausea with vomiting, unspecified: Secondary | ICD-10-CM

## 2017-12-25 MED ORDER — DICYCLOMINE HCL 20 MG PO TABS: 20.0000 mg | ORAL_TABLET | Freq: Two times a day (BID) | ORAL | 0 refills | Status: DC

## 2017-12-25 MED ORDER — ONDANSETRON 4 MG PO TBDP: 4.0000 mg | ORAL_TABLET | Freq: Three times a day (TID) | ORAL | 0 refills | Status: DC | PRN

## 2017-12-25 NOTE — Discharge Instructions (Addendum)
Zofran for nausea and vomiting as needed. Bently for stomach cramping. Keep hydrated, you urine should be clear to pale yellow in color. Bland diet as attached, advance as tolerated. Probiotics after diarrhea resolves. Monitor for any worsening of symptoms, nausea or vomiting not controlled by medication, worsening abdominal pain, fever, follow-up for reevaluation.

## 2017-12-25 NOTE — ED Triage Notes (Signed)
Pt c/o sharp abdominal pains, c/o vomiting twice today, has c/o diarrhea x15 times today. States its just straight liquid.

## 2017-12-25 NOTE — ED Provider Notes (Signed)
[facility]    CSN: [id number] Arrival date & time: 12/25/17  1723     History   Chief Complaint Chief Complaint  Patient presents with  . Abdominal Pain  . Diarrhea    HPI Randy Pratt is a 37 y.o. male.   37 year old male comes in for a 1 day history of nausea, vomiting, diarrhea, abdominal pain.  States pain is sharp in sensation, around periumbilical region, intermittent, worse prior to bowel movement.  He has had 2 episodes of nonbilious nonbloody vomit.  Has continued with mild nausea.  States has had more than 15 episodes of diarrhea today that is liquid.  Denies melena, hematochezia.  Denies fever, chills, night sweats.  No recent travels or antibiotic use.  States tried some Imodium, Pepto-Bismol, which helped with the abdominal pain, but continues with frequent stools.     History reviewed. No pertinent past medical history.  There are no active problems to display for this patient.   Past Surgical History:  Procedure Laterality Date  . HAND SURGERY  2012   L wrist, cut 8 tendons, 1 nerve and main artery       Home Medications    Prior to Admission medications   Medication Sig Start Date End Date Taking? Authorizing Provider  dicyclomine (BENTYL) 20 MG tablet Take 1 tablet (20 mg total) by mouth 2 (two) times daily. 12/25/17   Cathie Hoops, Amy V, PA-C  naproxen (NAPROSYN) 500 MG tablet Take 1 tablet (500 mg total) by mouth 2 (two) times daily. Patient not taking: Reported on 12/25/2017 03/17/17   Dessa Phi, MD  naproxen (NAPROSYN) 500 MG tablet Take 1 tablet (500 mg total) by mouth 2 (two) times daily. Patient not taking: Reported on 12/25/2017 05/21/17   Kellie Shropshire, PA-C  ondansetron (ZOFRAN ODT) 4 MG disintegrating tablet Take 1 tablet (4 mg total) by mouth every 8 (eight) hours as needed for nausea or vomiting. 12/25/17   Cathie Hoops, Amy V, PA-C  predniSONE (DELTASONE) 20 MG tablet Two daily with food Patient not taking: Reported on 12/25/2017 10/23/17    Elvina Sidle, MD  topiramate (TOPAMAX) 50 MG tablet Take 1 tablet (50 mg total) by mouth at bedtime. Patient not taking: Reported on 12/25/2017 10/23/17   Elvina Sidle, MD  traMADol-acetaminophen (ULTRACET) 37.5-325 MG tablet Take 1-2 tablets by mouth every 6 (six) hours as needed for severe pain. Patient not taking: Reported on 12/25/2017 03/18/17   Silvestre Mesi    Family History History reviewed. No pertinent family history.  Social History Social History   Tobacco Use  . Smoking status: Current Every Day Smoker    Packs/day: 0.50    Types: Cigarettes  . Smokeless tobacco: Never Used  Substance Use Topics  . Alcohol use: No  . Drug use: No     Allergies   Patient has no known allergies.   Review of Systems Review of Systems  Reason unable to perform ROS: See HPI as above.     Physical Exam Triage Vital Signs ED Triage Vitals [12/25/17 1800]  Enc Vitals Group     BP 119/82     Pulse Rate 75     Resp 16     Temp 97.9 F (36.6 C)     Temp src      SpO2 98 %     Weight      Height      Head Circumference      Peak Flow  Pain Score      Pain Loc      Pain Edu?      Excl. in GC?    No data found.  Updated Vital Signs BP 119/82   Pulse 75   Temp 97.9 F (36.6 C)   Resp 16   SpO2 98%   Physical Exam  Constitutional: He is oriented to person, place, and time. He appears well-developed and well-nourished. No distress.  HENT:  Head: Normocephalic and atraumatic.  Cardiovascular: Normal rate, regular rhythm and normal heart sounds. Exam reveals no gallop and no friction rub.  No murmur heard. Pulmonary/Chest: Effort normal and breath sounds normal. He has no wheezes. He has no rales.  Abdominal: Soft. He exhibits no mass. Bowel sounds are increased. There is no rigidity, no rebound, no guarding and no CVA tenderness.  Generalized abdominal pain on palpation without guarding or rebound.  Neurological: He is alert and oriented to person,  place, and time.  Skin: Skin is warm and dry.  Psychiatric: He has a normal mood and affect. His behavior is normal. Judgment normal.     UC Treatments / Results  Labs (all labs ordered are listed, but only abnormal results are displayed) Labs Reviewed - No data to display  EKG None  Radiology No results found.  Procedures Procedures (including critical care time)  Medications Ordered in UC Medications - No data to display  Initial Impression / Assessment and Plan / UC Course  I have reviewed the triage vital signs and the nursing notes.  Pertinent labs & imaging results that were available during my care of the patient were reviewed by me and considered in my medical decision making (see chart for details).    Discussed with patient no alarming signs on exam. Zofran for nausea. Push fluids. Bland diet, advance as tolerated. Return precautions given.  Final Clinical Impressions(s) / UC Diagnoses   Final diagnoses:  Nausea vomiting and diarrhea   ED Prescriptions    Medication Sig Dispense Auth. Provider   dicyclomine (BENTYL) 20 MG tablet Take 1 tablet (20 mg total) by mouth 2 (two) times daily. 20 tablet Yu, Amy V, PA-C   ondansetron (ZOFRAN ODT) 4 MG disintegrating tablet Take 1 tablet (4 mg total) by mouth every 8 (eight) hours as needed for nausea or vomiting. 20 tablet Threasa Alpha, [place] 12/25/17 1851

## 2018-01-22 ENCOUNTER — Ambulatory Visit (HOSPITAL_COMMUNITY)
Admission: EM | Admit: 2018-01-22 | Discharge: 2018-01-22 | Disposition: A | Discharge status: Home / Self Care | Payer: 59 | Attending: Family Medicine | Admitting: Family Medicine

## 2018-01-22 ENCOUNTER — Ambulatory Visit (INDEPENDENT_AMBULATORY_CARE_PROVIDER_SITE_OTHER): Payer: 59

## 2018-01-22 ENCOUNTER — Encounter (HOSPITAL_COMMUNITY): Payer: Self-pay | Admitting: Family Medicine

## 2018-01-22 DIAGNOSIS — S99921A Unspecified injury of right foot, initial encounter: Secondary | ICD-10-CM

## 2018-01-22 MED ORDER — DICLOFENAC SODIUM 1 % TD GEL
2.0000 g | Freq: Four times a day (QID) | TRANSDERMAL | 0 refills | Status: DC
Start: 2018-01-22 — End: 2019-10-12

## 2018-01-22 MED ORDER — MELOXICAM 15 MG PO TABS: 15.0000 mg | ORAL_TABLET | Freq: Every day | ORAL | 0 refills | Status: DC
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
## 2018-01-22 NOTE — Discharge Instructions (Signed)
X-ray negative for fracture or dislocation.  Start Mobic as directed.  He can apply Voltaren gel on affected area.  Tylenol as directed.  Continue ice compress, elevation, Ace wrap during activity.  He can continue crutches for pain relief.  Follow-up here with PCP for further evaluation if symptoms not improving, worsens.  Monitor for numbness and tingling of the toes, discoloration, follow-up for reevaluation.
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
## 2018-10-19 ENCOUNTER — Encounter (HOSPITAL_COMMUNITY): Payer: Self-pay

## 2018-10-19 ENCOUNTER — Other Ambulatory Visit: Payer: Self-pay

## 2018-10-19 ENCOUNTER — Ambulatory Visit (HOSPITAL_COMMUNITY)
Admission: EM | Admit: 2018-10-19 | Discharge: 2018-10-19 | Disposition: A | Discharge status: Home / Self Care | Payer: 59 | Attending: Internal Medicine | Admitting: Internal Medicine

## 2018-10-19 DIAGNOSIS — J111 Influenza due to unidentified influenza virus with other respiratory manifestations: Secondary | ICD-10-CM

## 2018-10-19 DIAGNOSIS — R69 Illness, unspecified: Secondary | ICD-10-CM | POA: Diagnosis not present

## 2018-10-19 MED ORDER — KETOROLAC TROMETHAMINE 60 MG/2ML IM SOLN: 60.0000 mg | Freq: Once | INTRAMUSCULAR | Status: DC

## 2018-10-19 MED ORDER — ONDANSETRON 4 MG PO TBDP: 4.0000 mg | ORAL_TABLET | Freq: Three times a day (TID) | ORAL | 0 refills | Status: DC | PRN

## 2018-10-19 MED ORDER — IBUPROFEN 800 MG PO TABS: 800.0000 mg | ORAL_TABLET | Freq: Three times a day (TID) | ORAL | 0 refills | Status: DC

## 2018-10-19 NOTE — ED Provider Notes (Signed)
[facility]    CSN: [id number] Arrival date & time: 10/19/18  1526     History   Chief Complaint Chief Complaint  Patient presents with  . Influenza    HPI Randy Pratt is a 38 y.o. male.   Randy Pratt presents with complaints of fever, body aches, chills, nausea, no vomiting, scratchy throat, runny nose. Started last night. No cough. No rash. Sons and MIL are also ill. No ear pain. No abdominal pain. Eating and drinking. Dayquil, nyquil, theraflu have been helpful. Tylenol last at 0800. He did get a flu vac this season. Without contributing medical history.     ROS per HPI.      History reviewed. No pertinent past medical history.  There are no active problems to display for this patient.   Past Surgical History:  Procedure Laterality Date  . HAND SURGERY  2012   L wrist, cut 8 tendons, 1 nerve and main artery       Home Medications    Prior to Admission medications   Medication Sig Start Date End Date Taking? Authorizing Provider  diclofenac sodium (VOLTAREN) 1 % GEL Apply 2 g topically 4 (four) times daily. 01/22/18   Cathie Hoops, Amy V, PA-C  dicyclomine (BENTYL) 20 MG tablet Take 1 tablet (20 mg total) by mouth 2 (two) times daily. 12/25/17   Cathie Hoops, Amy V, PA-C  ibuprofen (ADVIL,MOTRIN) 800 MG tablet Take 1 tablet (800 mg total) by mouth 3 (three) times daily. 10/19/18   Georgetta Haber, NP  meloxicam (MOBIC) 15 MG tablet Take 1 tablet (15 mg total) by mouth daily. 01/22/18   Cathie Hoops, Amy V, PA-C  ondansetron (ZOFRAN ODT) 4 MG disintegrating tablet Take 1 tablet (4 mg total) by mouth every 8 (eight) hours as needed for nausea or vomiting. 10/19/18   Georgetta Haber, NP  predniSONE (DELTASONE) 20 MG tablet Two daily with food Patient not taking: Reported on 12/25/2017 10/23/17   Elvina Sidle, MD  topiramate (TOPAMAX) 50 MG tablet Take 1 tablet (50 mg total) by mouth at bedtime. Patient not taking: Reported on 12/25/2017 10/23/17   Elvina Sidle, MD  traMADol-acetaminophen  (ULTRACET) 37.5-325 MG tablet Take 1-2 tablets by mouth every 6 (six) hours as needed for severe pain. Patient not taking: Reported on 12/25/2017 03/18/17   Silvestre Mesi    Family History History reviewed. No pertinent family history.  Social History Social History   Tobacco Use  . Smoking status: Current Every Day Smoker    Packs/day: 0.50    Types: Cigarettes  . Smokeless tobacco: Never Used  Substance Use Topics  . Alcohol use: No  . Drug use: No     Allergies   Patient has no known allergies.   Review of Systems Review of Systems   Physical Exam Triage Vital Signs ED Triage Vitals  Enc Vitals Group     BP 10/19/18 1635 113/78     Pulse Rate 10/19/18 1635 72     Resp 10/19/18 1635 18     Temp 10/19/18 1635 99.8 F (37.7 C)     Temp Source 10/19/18 1635 Tympanic     SpO2 10/19/18 1635 100 %     Weight 10/19/18 1637 175 lb (79.4 kg)     Height --      Head Circumference --      Peak Flow --      Pain Score 10/19/18 1635 10     Pain Loc --  Pain Edu? --      Excl. in GC? --    No data found.  Updated Vital Signs BP 113/78 (BP Location: Right Arm)   Pulse 72   Temp 99.8 F (37.7 C) (Tympanic)   Resp 18   Wt 175 lb (79.4 kg)   SpO2 100%   BMI 23.09 kg/m    Physical Exam Vitals signs reviewed.  Constitutional:      Appearance: He is well-developed. He is ill-appearing.  HENT:     Head: Normocephalic and atraumatic.     Right Ear: Tympanic membrane, ear canal and external ear normal.     Left Ear: Tympanic membrane, ear canal and external ear normal.     Nose: Nose normal.     Right Sinus: No maxillary sinus tenderness or frontal sinus tenderness.     Left Sinus: No maxillary sinus tenderness or frontal sinus tenderness.     Mouth/Throat:     Pharynx: Uvula midline.  Eyes:     Conjunctiva/sclera: Conjunctivae normal.     Pupils: Pupils are equal, round, and reactive to light.  Neck:     Musculoskeletal: Normal range of motion.    Cardiovascular:     Rate and Rhythm: Normal rate and regular rhythm.  Pulmonary:     Effort: Pulmonary effort is normal.     Breath sounds: Normal breath sounds.  Lymphadenopathy:     Cervical: No cervical adenopathy.  Skin:    General: Skin is warm and dry.  Neurological:     Mental Status: He is alert and oriented to person, place, and time.      UC Treatments / Results  Labs (all labs ordered are listed, but only abnormal results are displayed) Labs Reviewed - No data to display  EKG None  Radiology No results found.  Procedures Procedures (including critical care time)  Medications Ordered in UC Medications  ketorolac (TORADOL) injection 60 mg (has no administration in time range)    Initial Impression / Assessment and Plan / UC Course  I have reviewed the triage vital signs and the nursing notes.  Pertinent labs & imaging results that were available during my care of the patient were reviewed by me and considered in my medical decision making (see chart for details).     Non toxic. Afebrile. Vitals stable. History and physical consistent with viral illness. Supportive cares recommended. If symptoms worsen or do not improve in the next week to return to be seen or to follow up with PCP.  Patient verbalized understanding and agreeable to plan.    Final Clinical Impressions(s) / UC Diagnoses   Final diagnoses:  Influenza-like illness     Discharge Instructions     Push fluids to ensure adequate hydration and keep secretions thin.  Tylenol and/or ibuprofen as needed for pain or fevers.  Don't take additional ibuprofen for another 6 hours.  Zofran as needed for nausea.  Rest.  If symptoms worsen or do not improve in the next week to return to be seen or to follow up with your PCP.     ED Prescriptions    Medication Sig Dispense Auth. Provider   ondansetron (ZOFRAN ODT) 4 MG disintegrating tablet Take 1 tablet (4 mg total) by mouth every 8 (eight) hours  as needed for nausea or vomiting. 20 tablet Linus Mako B, NP   ibuprofen (ADVIL,MOTRIN) 800 MG tablet Take 1 tablet (800 mg total) by mouth 3 (three) times daily. 21 tablet Georgetta Haber, NP  Controlled Substance Prescriptions [place] Controlled Substance Registry consulted? Not Applicable   Georgetta Haber, NP 10/19/18 1906

## 2018-10-19 NOTE — ED Triage Notes (Signed)
Pt cc fever, headache, body aches, chills, and nausea and fever off and on this started last night.

## 2018-10-19 NOTE — Discharge Instructions (Signed)
Push fluids to ensure adequate hydration and keep secretions thin.  Tylenol and/or ibuprofen as needed for pain or fevers.  Don't take additional ibuprofen for another 6 hours.  Zofran as needed for nausea.  Rest.  If symptoms worsen or do not improve in the next week to return to be seen or to follow up with your PCP.

## 2019-08-04 ENCOUNTER — Ambulatory Visit
Admission: EM | Admit: 2019-08-04 | Discharge: 2019-08-04 | Disposition: A | Discharge status: Home / Self Care | Payer: 59 | Attending: Emergency Medicine | Admitting: Emergency Medicine

## 2019-08-04 ENCOUNTER — Other Ambulatory Visit: Payer: Self-pay

## 2019-08-04 DIAGNOSIS — R350 Frequency of micturition: Secondary | ICD-10-CM | POA: Diagnosis not present

## 2019-08-04 DIAGNOSIS — Z20828 Contact with and (suspected) exposure to other viral communicable diseases: Secondary | ICD-10-CM

## 2019-08-04 DIAGNOSIS — M545 Low back pain: Secondary | ICD-10-CM | POA: Diagnosis not present

## 2019-08-04 DIAGNOSIS — R5383 Other fatigue: Secondary | ICD-10-CM | POA: Diagnosis not present

## 2019-08-04 DIAGNOSIS — R6889 Other general symptoms and signs: Secondary | ICD-10-CM

## 2019-08-04 DIAGNOSIS — R1030 Lower abdominal pain, unspecified: Secondary | ICD-10-CM | POA: Diagnosis not present

## 2019-08-04 DIAGNOSIS — Z20822 Contact with and (suspected) exposure to covid-19: Secondary | ICD-10-CM

## 2019-08-04 LAB — POCT URINALYSIS DIP (MANUAL ENTRY)
Bilirubin, UA: NEGATIVE
Blood, UA: NEGATIVE
Glucose, UA: NEGATIVE mg/dL
Ketones, POC UA: NEGATIVE mg/dL
Leukocytes, UA: NEGATIVE
Nitrite, UA: NEGATIVE
Protein Ur, POC: NEGATIVE mg/dL
Spec Grav, UA: 1.02 (ref 1.010–1.025)
Urobilinogen, UA: 0.2 E.U./dL
pH, UA: 7 (ref 5.0–8.0)

## 2019-08-04 MED ORDER — IBUPROFEN 800 MG PO TABS: 800.0000 mg | ORAL_TABLET | Freq: Three times a day (TID) | ORAL | 0 refills | Status: DC

## 2019-08-04 NOTE — ED Provider Notes (Signed)
[place]   [id number] 08/04/19 Arrival Time: 6160  CC: Flu-like symptoms  SUBJECTIVE:  Randy Pratt is a 38 y.o. male who presents with complaint of lower back discomfort, lower abdominal discomfort, decreased appetite, frequent urination, and fatigue x 1.5 weeks.  Denies a precipitating event, trauma, close contacts with similar symptoms, recent travel or antibiotic use. Denies sick exposure to COVID, flu or strep.  Localizes discomfort to low back.  Describes as intermittent, achy and 8/10.  Localizes abdominal discomfort to LLQ, describes as intermittent, and sharp, 6-7/10.   Has tried ibuprofen and tylenol without relief.  Denies aggravating factors.  Denies similar symptoms in the past.  Last BM couple of hours ago with loose stools and smaller than normal for patient.  Complains of watery diarrhea this morning (few episodes of diarrhea since symptoms began).   Denies fever, chills, rhinorrhea, congestion, sore throat, cough, nausea, vomiting, chest pain, SOB, diarrhea, constipation, hematochezia, melena, dysuria, difficulty urinating, flank pain, loss of bowel or bladder function.   No LMP for male patient.  ROS: As per HPI.  All other pertinent ROS negative.     History reviewed. No pertinent past medical history. Past Surgical History:  Procedure Laterality Date  . HAND SURGERY  2012   L wrist, cut 8 tendons, 1 nerve and main artery   No Known Allergies No current facility-administered medications on file prior to encounter.   Current Outpatient Medications on File Prior to Encounter  Medication Sig Dispense Refill  . diclofenac sodium (VOLTAREN) 1 % GEL Apply 2 g topically 4 (four) times daily. 1 Tube 0  . dicyclomine (BENTYL) 20 MG tablet Take 1 tablet (20 mg total) by mouth 2 (two) times daily. 20 tablet 0  . meloxicam (MOBIC) 15 MG tablet Take 1 tablet (15 mg total) by mouth daily. 15 tablet 0  . ondansetron (ZOFRAN ODT) 4 MG disintegrating tablet Take 1  tablet (4 mg total) by mouth every 8 (eight) hours as needed for nausea or vomiting. 20 tablet 0  . topiramate (TOPAMAX) 50 MG tablet Take 1 tablet (50 mg total) by mouth at bedtime. (Patient not taking: Reported on 12/25/2017) 30 tablet 2  . traMADol-acetaminophen (ULTRACET) 37.5-325 MG tablet Take 1-2 tablets by mouth every 6 (six) hours as needed for severe pain. (Patient not taking: Reported on 12/25/2017) 60 tablet 0   Social History   Socioeconomic History  . Marital status: Married    Spouse name: Not on file  . Number of children: Not on file  . Years of education: Not on file  . Highest education level: Not on file  Occupational History  . Not on file  Tobacco Use  . Smoking status: Current Every Day Smoker    Packs/day: 0.50    Types: Cigarettes  . Smokeless tobacco: Never Used  Substance and Sexual Activity  . Alcohol use: No  . Drug use: No  . Sexual activity: Yes    Birth control/protection: Condom  Other Topics Concern  . Not on file  Social History Narrative  . Not on file   Social Determinants of Health   Financial Resource Strain:   . Difficulty of Paying Living Expenses: Not on file  Food Insecurity:   . Worried About Charity fundraiser in the Last Year: Not on file  . Ran Out of Food in the Last Year: Not on file  Transportation Needs:   . Lack of Transportation (Medical): Not on file  . Lack of Transportation (Non-Medical):  Not on file  Physical Activity:   . Days of Exercise per Week: Not on file  . Minutes of Exercise per Session: Not on file  Stress:   . Feeling of Stress : Not on file  Social Connections:   . Frequency of Communication with Friends and Family: Not on file  . Frequency of Social Gatherings with Friends and Family: Not on file  . Attends Religious Services: Not on file  . Active Member of Clubs or Organizations: Not on file  . Attends BankerClub or Organization Meetings: Not on file  . Marital Status: Not on file  Intimate Partner  Violence:   . Fear of Current or Ex-Partner: Not on file  . Emotionally Abused: Not on file  . Physically Abused: Not on file  . Sexually Abused: Not on file   Family History  Problem Relation Age of Onset  . Healthy Mother   . Healthy Father      OBJECTIVE:  Vitals:   08/04/19 1649  BP: (!) 148/86  Pulse: 79  Resp: 16  Temp: 98.2 F (36.8 C)  TempSrc: Oral  SpO2: 96%    General appearance: Alert; fatigue appearing, nontoxic HEENT: NCAT.  Oropharynx clear.  Lungs: clear to auscultation bilaterally without adventitious breath sounds Heart: regular rate and rhythm.   Abdomen: soft, non-distended; normal active bowel sounds; TTP with deep palpation over LLQ; nontender at McBurney's point; negative Murphy's sign; no guarding Back: no CVA tenderness; no midline tenderness Extremities: no edema; symmetrical with no gross deformities Skin: warm and dry Neurologic: normal gait Psychological: alert and cooperative; normal mood and affect  LABS: Results for orders placed or performed during the hospital encounter of 08/04/19 (from the past 24 hour(s))  POCT urinalysis dipstick     Status: None   Collection Time: 08/04/19  4:58 PM  Result Value Ref Range   Color, UA yellow yellow   Clarity, UA clear clear   Glucose, UA negative negative mg/dL   Bilirubin, UA negative negative   Ketones, POC UA negative negative mg/dL   Spec Grav, UA [id number] [id number] - 1.025   Blood, UA negative negative   pH, UA 7.0 5.0 - 8.0   Protein Ur, POC negative negative mg/dL   Urobilinogen, UA 0.2 0.2 or 1.0 E.U./dL   Nitrite, UA Negative Negative   Leukocytes, UA Negative Negative   ASSESSMENT & PLAN:  1. Flu-like symptoms   2. Suspected COVID-19 virus infection     Meds ordered this encounter  Medications  . ibuprofen (ADVIL) 800 MG tablet    Sig: Take 1 tablet (800 mg total) by mouth 3 (three) times daily.    Dispense:  21 tablet    Refill:  0    Order Specific Question:   Supervising  Provider    Answer:   Eustace MooreELSON, YVONNE SUE [[id number]]    COVID testing ordered.  It will take between 5-7 days for test results.  Someone will contact you regarding abnormal results.    In the meantime: You should remain isolated in your home for 10 days from symptom onset AND greater than 72 hours after symptoms resolution (absence of fever without the use of fever-reducing medication and improvement in respiratory symptoms), whichever is longer Get plenty of rest and push fluids Ibuprofen 800 mg prescribed.  Take as directed for body aches Call or go to the ED if you have any new or worsening symptoms such as fever, cough, shortness of breath, chest tightness, chest pain, turning blue,  changes in mental status, worsening abdominal pain/ back pain, changes bowel or urinary habits, etc...  Reviewed expectations re: course of current medical issues. Questions answered. Outlined signs and symptoms indicating need for more acute intervention. Patient verbalized understanding. After Visit Summary given.   Rennis Harding, PA-C 08/04/19 1720

## 2019-08-04 NOTE — Discharge Instructions (Addendum)
COVID testing ordered.  It will take between 5-7 days for test results.  Someone will contact you regarding abnormal results.    In the meantime: You should remain isolated in your home for 10 days from symptom onset AND greater than 72 hours after symptoms resolution (absence of fever without the use of fever-reducing medication and improvement in respiratory symptoms), whichever is longer Get plenty of rest and push fluids Ibuprofen 800 mg prescribed.  Take as directed for body aches Call or go to the ED if you have any new or worsening symptoms such as fever, cough, shortness of breath, chest tightness, chest pain, turning blue, changes in mental status, worsening abdominal pain/ back pain, changes bowel or urinary habits, etc..Marland Kitchen

## 2019-08-04 NOTE — ED Triage Notes (Signed)
Pt presents to UC w/ c/o lower back pain, lower abd pain, decreased appetite, frequent urination, fatigue x1.5 weeks.

## 2019-08-06 LAB — NOVEL CORONAVIRUS, NAA: SARS-CoV-2, NAA: NOT DETECTED

## 2019-09-20 ENCOUNTER — Encounter (HOSPITAL_COMMUNITY): Payer: Self-pay | Admitting: Emergency Medicine

## 2019-09-20 ENCOUNTER — Emergency Department (HOSPITAL_COMMUNITY)
Admission: EM | Admit: 2019-09-20 | Discharge: 2019-09-20 | Disposition: A | Discharge status: Home / Self Care | Payer: 59 | Attending: Emergency Medicine | Admitting: Emergency Medicine

## 2019-09-20 ENCOUNTER — Other Ambulatory Visit: Payer: Self-pay

## 2019-09-20 ENCOUNTER — Emergency Department (HOSPITAL_COMMUNITY): Payer: 59

## 2019-09-20 ENCOUNTER — Ambulatory Visit
Admission: EM | Admit: 2019-09-20 | Discharge: 2019-09-20 | Disposition: A | Discharge status: Home / Self Care | Payer: 59 | Source: Home / Self Care

## 2019-09-20 DIAGNOSIS — Z79899 Other long term (current) drug therapy: Secondary | ICD-10-CM | POA: Insufficient documentation

## 2019-09-20 DIAGNOSIS — F17210 Nicotine dependence, cigarettes, uncomplicated: Secondary | ICD-10-CM | POA: Diagnosis not present

## 2019-09-20 DIAGNOSIS — R109 Unspecified abdominal pain: Secondary | ICD-10-CM | POA: Diagnosis not present

## 2019-09-20 DIAGNOSIS — R319 Hematuria, unspecified: Secondary | ICD-10-CM | POA: Diagnosis present

## 2019-09-20 LAB — URINALYSIS, ROUTINE W REFLEX MICROSCOPIC
Bilirubin Urine: NEGATIVE
Glucose, UA: NEGATIVE mg/dL
Ketones, ur: NEGATIVE mg/dL
Leukocytes,Ua: NEGATIVE
Nitrite: NEGATIVE
Protein, ur: NEGATIVE mg/dL
RBC / HPF: >50 RBC/hpf — ABNORMAL HIGH (ref 0–5)
Specific Gravity, Urine: 1.005 (ref 1.005–1.030)
pH: 7 (ref 5.0–8.0)

## 2019-09-20 MED ORDER — ONDANSETRON HCL 4 MG/2ML IJ SOLN
4.0000 mg | Freq: Once | INTRAMUSCULAR | Status: AC
  Administered 2019-09-20: 4 mg via INTRAVENOUS
  Filled 2019-09-20: qty 2

## 2019-09-20 MED ORDER — FENTANYL CITRATE (PF) 100 MCG/2ML IJ SOLN
100.0000 ug | Freq: Once | INTRAMUSCULAR | Status: AC
  Administered 2019-09-20: 100 ug via INTRAVENOUS
  Filled 2019-09-20: qty 2

## 2019-09-20 MED ORDER — TAMSULOSIN HCL 0.4 MG PO CAPS: 0.4000 mg | ORAL_CAPSULE | Freq: Every day | ORAL | 0 refills | Status: DC

## 2019-09-20 MED ORDER — HYDROCODONE-ACETAMINOPHEN 5-325 MG PO TABS: 1.0000 | ORAL_TABLET | Freq: Four times a day (QID) | ORAL | 0 refills | Status: DC | PRN

## 2019-09-20 MED ORDER — HYDROMORPHONE HCL 1 MG/ML IJ SOLN
1.0000 mg | Freq: Once | INTRAMUSCULAR | Status: AC
  Administered 2019-09-20: 1 mg via INTRAMUSCULAR
  Filled 2019-09-20: qty 1

## 2019-09-20 MED ORDER — ONDANSETRON HCL 4 MG PO TABS: 4.0000 mg | ORAL_TABLET | Freq: Three times a day (TID) | ORAL | 0 refills | Status: DC | PRN

## 2019-09-20 NOTE — ED Notes (Signed)
edp in room  

## 2019-09-20 NOTE — ED Notes (Signed)
Pt to ct 

## 2019-09-20 NOTE — Discharge Instructions (Signed)
Make sure you are staying well-hydrated water. Take Flomax daily until symptoms are completely resolved. Use Zofran as needed for nausea or vomiting. Use Tylenol and ibuprofen as needed for mild to moderate pain.  Use Norco as needed for severe breakthrough pain.  Have caution, this may make you tired or groggy.  Do not drive or operate heavy machinery while taking this medicine. Follow-up with your urologist if your symptoms are not improving. Return to the emergency room if you develop high fevers, persistent vomiting, severe worsening pain, inability to urinate, or any new, worsening, or concerning symptoms.

## 2019-09-20 NOTE — ED Provider Notes (Signed)
[facility] EMERGENCY DEPARTMENT Provider Note   CSN: [id number] Arrival date & time: 09/20/19  1700     History Chief Complaint  Patient presents with  . Hematuria    Randy Pratt is a 39 y.o. male presenting for evaluation of flank pain and hematuria.   Patient states that the past 3 to 4 days, he has been having persistent left-sided pain.  He states pain is constant, with intermittent sharp stabbing episodes.  It is mostly in the left side, initially was in his back but now is towards the left anterior abdomen.  Pain is worse during urination, but slightly improved just afterwards.  He reports associated nausea without vomiting.  He reports hematuria yesterday, no dysuria.  Patient states he feels he is not completely emptying his bladder after urination.  He denies history of similar.  Denies history of kidney stones.  He denies fevers, chills, chest pain, shortness of breath, cough.  He has no medical problems, takes no medications daily.  He has taken 400 mg of ibuprofen 2-3 times a day without significant improvement of symptoms.  He has not tried anything else.  He denies testicular or penile pain or swelling.  He denies penile discharge.  HPI     History reviewed. No pertinent past medical history.  There are no problems to display for this patient.   Past Surgical History:  Procedure Laterality Date  . HAND SURGERY  2012   L wrist, cut 8 tendons, 1 nerve and main artery       Family History  Problem Relation Age of Onset  . Healthy Mother   . Healthy Father     Social History   Tobacco Use  . Smoking status: Current Every Day Smoker    Packs/day: 0.50    Types: Cigarettes  . Smokeless tobacco: Never Used  Substance Use Topics  . Alcohol use: No  . Drug use: No    Home Medications Prior to Admission medications   Medication Sig Start Date End Date Taking? Authorizing Provider  diclofenac sodium (VOLTAREN) 1 % GEL Apply 2 g topically 4 (four) times  daily. 01/22/18   Cathie Hoops, Amy V, PA-C  dicyclomine (BENTYL) 20 MG tablet Take 1 tablet (20 mg total) by mouth 2 (two) times daily. 12/25/17   Cathie Hoops, Amy V, PA-C  HYDROcodone-acetaminophen (NORCO/VICODIN) 5-325 MG tablet Take 1 tablet by mouth every 6 (six) hours as needed for severe pain. 09/20/19   Janos Shampine, PA-C  ibuprofen (ADVIL) 800 MG tablet Take 1 tablet (800 mg total) by mouth 3 (three) times daily. 08/04/19   Wurst, [place], PA-C  meloxicam (MOBIC) 15 MG tablet Take 1 tablet (15 mg total) by mouth daily. 01/22/18   Cathie Hoops, Amy V, PA-C  ondansetron (ZOFRAN ODT) 4 MG disintegrating tablet Take 1 tablet (4 mg total) by mouth every 8 (eight) hours as needed for nausea or vomiting. 10/19/18   Georgetta Haber, NP  ondansetron (ZOFRAN) 4 MG tablet Take 1 tablet (4 mg total) by mouth every 8 (eight) hours as needed for nausea or vomiting. 09/20/19   Malayshia All, PA-C  tamsulosin (FLOMAX) 0.4 MG CAPS capsule Take 1 capsule (0.4 mg total) by mouth daily. 09/20/19   Thunder [place], PA-C  topiramate (TOPAMAX) 50 MG tablet Take 1 tablet (50 mg total) by mouth at bedtime. Patient not taking: Reported on 12/25/2017 10/23/17   Elvina Sidle, MD  traMADol-acetaminophen (ULTRACET) 37.5-325 MG tablet Take 1-2 tablets by mouth every 6 (six) hours as needed  for severe pain. Patient not taking: Reported on 12/25/2017 03/18/17   Tereasa Coop, PA-C    Allergies    Patient has no known allergies.  Review of Systems   Review of Systems  Gastrointestinal: Positive for nausea.  Genitourinary: Positive for flank pain and hematuria.  All other systems reviewed and are negative.   Physical Exam Updated Vital Signs BP 133/90 (BP Location: Right Arm)   Pulse 99   Temp 98.5 F (36.9 C) (Oral)   Resp 20   Ht 6\' 1"  (1.854 m)   Wt 79.4 kg   SpO2 100%   BMI 23.09 kg/m   Physical Exam Vitals and nursing note reviewed.  Constitutional:      General: He is not in acute distress.    Appearance: He is  well-developed.     Comments: Appears uncomfortable due to pain, otherwise nontoxic  HENT:     Head: Normocephalic and atraumatic.  Eyes:     Extraocular Movements: Extraocular movements intact.     Conjunctiva/sclera: Conjunctivae normal.     Pupils: Pupils are equal, round, and reactive to light.  Cardiovascular:     Rate and Rhythm: Normal rate and regular rhythm.     Pulses: Normal pulses.  Pulmonary:     Effort: Pulmonary effort is normal. No respiratory distress.     Breath sounds: Normal breath sounds. No wheezing.  Abdominal:     General: There is no distension.     Palpations: Abdomen is soft. There is no mass.     Tenderness: There is no abdominal tenderness. There is no right CVA tenderness, left CVA tenderness, guarding or rebound.     Comments: No CVA tenderness.  Mild discomfort with palpation of anterior abdomen mostly in the left lower quadrant and suprapubic abdomen.  No rigidity, guarding, distention.  Negative rebound.  No signs of peritonitis.  Musculoskeletal:        General: Normal range of motion.     Cervical back: Normal range of motion and neck supple.     Right lower leg: No edema.     Left lower leg: No edema.  Skin:    General: Skin is warm and dry.     Capillary Refill: Capillary refill takes less than 2 seconds.  Neurological:     Mental Status: He is alert and oriented to person, place, and time.     ED Results / Procedures / Treatments   Labs (all labs ordered are listed, but only abnormal results are displayed) Labs Reviewed  URINALYSIS, ROUTINE W REFLEX MICROSCOPIC - Abnormal; Notable for the following components:      Result Value   Hgb urine dipstick LARGE (*)    RBC / HPF >50 (*)    Bacteria, UA RARE (*)    All other components within normal limits    EKG None  Radiology CT Renal Stone Study  Result Date: 09/20/2019 CLINICAL DATA:  Hematuria and left flank pain. EXAM: CT ABDOMEN AND PELVIS WITHOUT CONTRAST TECHNIQUE: Multidetector  CT imaging of the abdomen and pelvis was performed following the standard protocol without IV contrast. COMPARISON:  None. FINDINGS: Lower chest: No acute abnormality. Hepatobiliary: No focal liver abnormality is seen. No gallstones, gallbladder wall thickening, or biliary dilatation. Pancreas: Unremarkable. No pancreatic ductal dilatation or surrounding inflammatory changes. Spleen: Normal in size without focal abnormality. Adrenals/Urinary Tract: Adrenal glands are unremarkable. Kidneys are normal, without focal lesion or hydronephrosis. A 2 mm nonobstructing renal stone is seen within the lower  pole of the right kidney. Bladder is unremarkable. Stomach/Bowel: Stomach is within normal limits. Appendix appears normal. No evidence of bowel wall thickening, distention, or inflammatory changes. Vascular/Lymphatic: No significant vascular findings are present. No enlarged abdominal or pelvic lymph nodes. Reproductive: Prostate is unremarkable. Other: No abdominal wall hernia or abnormality. No abdominopelvic ascites. Musculoskeletal: No acute or significant osseous findings. IMPRESSION: 1. 2 mm nonobstructing renal stone within the right kidney. Electronically Signed   By: Aram Candela M.D.   On: 09/20/2019 20:36    Procedures Procedures (including critical care time)  Medications Ordered in ED Medications  fentaNYL (SUBLIMAZE) injection 100 mcg (100 mcg Intravenous Given 09/20/19 1909)  ondansetron (ZOFRAN) injection 4 mg (4 mg Intravenous Given 09/20/19 1909)  HYDROmorphone (DILAUDID) injection 1 mg (1 mg Intramuscular Given 09/20/19 2007)    ED Course  I have reviewed the triage vital signs and the nursing notes.  Pertinent labs & imaging results that were available during my care of the patient were reviewed by me and considered in my medical decision making (see chart for details).    MDM Rules/Calculators/A&P                      Patient presenting for evaluation of left-sided flank pain,  hematuria, nausea.  On exam, patient appears very uncomfortable.  History and physical consistent with kidney stone.  Also consider diverticulitis, but lower suspicion due to lack of abnormal bowel movements.  As patient does not have a history of same, will obtain urine and CT for further evaluation.  Urine shows large blood, no signs of infection.  Consistent with kidney stone.  CT does not show stone in the ureter.  Does show a small stone in the right kidney, likely not contributing to patient's pain.  Consider recently passed stone.  On reevaluation, patient reports pain is improved.  Discussed importance of hydration and continued symptom control and follow-up with urology if symptoms not improving.  Unable to obtain a bladder scan today as machine is broken.  As such, cannot obtain post void bladder volume.  However bladder is not grossly or abnormally distended on CT.  Discussed findings and plan with patient, who is agreeable.  At this time, patient appears safe for discharge.  Return precautions given.  Patient states he understands and agrees to plan.  Final Clinical Impression(s) / ED Diagnoses Final diagnoses:  Left flank pain    Rx / DC Orders ED Discharge Orders         Ordered    ondansetron (ZOFRAN) 4 MG tablet  Every 8 hours PRN     09/20/19 2048    HYDROcodone-acetaminophen (NORCO/VICODIN) 5-325 MG tablet  Every 6 hours PRN     09/20/19 2048    tamsulosin (FLOMAX) 0.4 MG CAPS capsule  Daily     09/20/19 2049           Alveria Apley, PA-C 09/20/19 2200    Glynn Octave, MD 09/20/19 2323

## 2019-09-20 NOTE — ED Triage Notes (Signed)
Pt presents to UC w/ c/o blood in urine, frequent urination, left lower back and abd pain x2 days.

## 2019-09-20 NOTE — ED Triage Notes (Signed)
Pt sent from UC and reports nausea,hematuria,sweating, left flank/back pain  radiating to LLQ x2 days.pt reports urinary frequency with little output. Pt reports doesn't feel as if bladder is being emptied.

## 2019-10-12 ENCOUNTER — Emergency Department (HOSPITAL_COMMUNITY): Payer: 59

## 2019-10-12 ENCOUNTER — Encounter (HOSPITAL_COMMUNITY): Payer: Self-pay | Admitting: Emergency Medicine

## 2019-10-12 ENCOUNTER — Other Ambulatory Visit: Payer: Self-pay

## 2019-10-12 ENCOUNTER — Emergency Department (HOSPITAL_COMMUNITY)
Admission: EM | Admit: 2019-10-12 | Discharge: 2019-10-12 | Disposition: A | Discharge status: Home / Self Care | Payer: 59 | Attending: Emergency Medicine | Admitting: Emergency Medicine

## 2019-10-12 DIAGNOSIS — R1032 Left lower quadrant pain: Secondary | ICD-10-CM | POA: Diagnosis present

## 2019-10-12 DIAGNOSIS — R509 Fever, unspecified: Secondary | ICD-10-CM | POA: Diagnosis not present

## 2019-10-12 DIAGNOSIS — F17210 Nicotine dependence, cigarettes, uncomplicated: Secondary | ICD-10-CM | POA: Insufficient documentation

## 2019-10-12 DIAGNOSIS — R110 Nausea: Secondary | ICD-10-CM | POA: Insufficient documentation

## 2019-10-12 DIAGNOSIS — K529 Noninfective gastroenteritis and colitis, unspecified: Secondary | ICD-10-CM | POA: Diagnosis not present

## 2019-10-12 LAB — HEPATIC FUNCTION PANEL
ALT: 15 U/L (ref 0–44)
AST: 19 U/L (ref 15–41)
Albumin: 4.5 g/dL (ref 3.5–5.0)
Alkaline Phosphatase: 74 U/L (ref 38–126)
Bilirubin, Direct: 0.1 mg/dL (ref 0.0–0.2)
Indirect Bilirubin: 0.4 mg/dL (ref 0.3–0.9)
Total Bilirubin: 0.5 mg/dL (ref 0.3–1.2)
Total Protein: 7.2 g/dL (ref 6.5–8.1)

## 2019-10-12 LAB — URINALYSIS, ROUTINE W REFLEX MICROSCOPIC
Bilirubin Urine: NEGATIVE
Glucose, UA: NEGATIVE mg/dL
Hgb urine dipstick: NEGATIVE
Ketones, ur: NEGATIVE mg/dL
Leukocytes,Ua: NEGATIVE
Nitrite: NEGATIVE
Protein, ur: NEGATIVE mg/dL
Specific Gravity, Urine: 1.004 — ABNORMAL LOW (ref 1.005–1.030)
pH: 7 (ref 5.0–8.0)

## 2019-10-12 LAB — CBC
HCT: 47.5 % (ref 39.0–52.0)
Hemoglobin: 15.5 g/dL (ref 13.0–17.0)
MCH: 28.2 pg (ref 26.0–34.0)
MCHC: 32.6 g/dL (ref 30.0–36.0)
MCV: 86.5 fL (ref 80.0–100.0)
Platelets: 260 10*3/uL (ref 150–400)
RBC: 5.49 MIL/uL (ref 4.22–5.81)
RDW: 13.3 % (ref 11.5–15.5)
WBC: 9.2 10*3/uL (ref 4.0–10.5)
nRBC: 0 % (ref 0.0–0.2)

## 2019-10-12 LAB — BASIC METABOLIC PANEL
Anion gap: 10 (ref 5–15)
BUN: 7 mg/dL (ref 6–20)
CO2: 23 mmol/L (ref 22–32)
Calcium: 9 mg/dL (ref 8.9–10.3)
Chloride: 106 mmol/L (ref 98–111)
Creatinine, Ser: 0.81 mg/dL (ref 0.61–1.24)
GFR calc Af Amer: >60 mL/min (ref >60)
GFR calc non Af Amer: >60 mL/min (ref >60)
Glucose, Bld: 106 mg/dL — ABNORMAL HIGH (ref 70–99)
Potassium: 3.9 mmol/L (ref 3.5–5.1)
Sodium: 139 mmol/L (ref 135–145)

## 2019-10-12 MED ORDER — ONDANSETRON HCL 4 MG/2ML IJ SOLN
4.0000 mg | Freq: Once | INTRAMUSCULAR | Status: AC
  Administered 2019-10-12: 4 mg via INTRAVENOUS
  Filled 2019-10-12: qty 2

## 2019-10-12 MED ORDER — HYDROMORPHONE HCL 1 MG/ML IJ SOLN
0.5000 mg | Freq: Once | INTRAMUSCULAR | Status: AC
  Administered 2019-10-12: 0.5 mg via INTRAVENOUS
  Filled 2019-10-12: qty 1

## 2019-10-12 MED ORDER — IOHEXOL 300 MG/ML  SOLN
100.0000 mL | Freq: Once | INTRAMUSCULAR | Status: AC | PRN
  Administered 2019-10-12: 100 mL via INTRAVENOUS

## 2019-10-12 MED ORDER — CIPROFLOXACIN HCL 250 MG PO TABS
500.0000 mg | ORAL_TABLET | Freq: Once | ORAL | Status: AC
  Administered 2019-10-12: 500 mg via ORAL
  Filled 2019-10-12: qty 2

## 2019-10-12 MED ORDER — METRONIDAZOLE 500 MG PO TABS: 500.0000 mg | ORAL_TABLET | Freq: Three times a day (TID) | ORAL | 0 refills | Status: DC

## 2019-10-12 MED ORDER — CIPROFLOXACIN HCL 500 MG PO TABS: 500.0000 mg | ORAL_TABLET | Freq: Two times a day (BID) | ORAL | 0 refills | Status: DC

## 2019-10-12 MED ORDER — METRONIDAZOLE 500 MG PO TABS
500.0000 mg | ORAL_TABLET | Freq: Once | ORAL | Status: AC
  Administered 2019-10-12: 500 mg via ORAL
  Filled 2019-10-12: qty 1

## 2019-10-12 MED ORDER — MORPHINE SULFATE (PF) 4 MG/ML IV SOLN
4.0000 mg | Freq: Once | INTRAVENOUS | Status: AC
  Administered 2019-10-12: 4 mg via INTRAVENOUS
  Filled 2019-10-12: qty 1

## 2019-10-12 MED ORDER — OXYCODONE-ACETAMINOPHEN 5-325 MG PO TABS: 1.0000 | ORAL_TABLET | ORAL | 0 refills | Status: DC | PRN

## 2019-10-12 NOTE — ED Triage Notes (Signed)
Pt reports nausea,hematuria,sweating, left flank/back pain  radiating to LLQ for 3 weeks .Pt reports urinary frequency.

## 2019-10-12 NOTE — ED Provider Notes (Signed)
[facility] Provider Note   CSN: [id number] Arrival date & time: 10/12/19  1553     History Chief Complaint  Patient presents with  . Flank Pain    Randy Pratt is a 39 y.o. male.  HPI      Randy Pratt is a 39 y.o. male who presents to the Emergency Department complaining of persistent left flank and left lower quadrant pain.  Pain is been associated with nausea and diarrhea, fever and chills, and intermittent diaphoresis.  Symptoms have been present for 3 weeks.  He also reports urinary frequency.  He was seen here on 09/20/2019 for left flank pain.  He had a CT stone study that shows a 2 mm nonobstructing renal stone without hydronephrosis.  He reports that his pain has been gradually worsening and increases with weight bearing and certain movements. Fever at home is subjective.  He has been taking over-the-counter pain relievers without improvement.  He denies vomiting, swelling or pain of his testicles or penis.  No penile discharge.  No recent new medications or history of back pain or surgical procedures.    History reviewed. No pertinent past medical history.  There are no problems to display for this patient.   Past Surgical History:  Procedure Laterality Date  . HAND SURGERY  2012   L wrist, cut 8 tendons, 1 nerve and main artery       Family History  Problem Relation Age of Onset  . Healthy Mother   . Healthy Father     Social History   Tobacco Use  . Smoking status: Current Every Day Smoker    Packs/day: 0.50    Types: Cigarettes  . Smokeless tobacco: Never Used  Substance Use Topics  . Alcohol use: No  . Drug use: No    Home Medications Prior to Admission medications   Medication Sig Start Date End Date Taking? Authorizing Provider  diclofenac sodium (VOLTAREN) 1 % GEL Apply 2 g topically 4 (four) times daily. 01/22/18   Cathie Hoops, Amy V, PA-C  dicyclomine (BENTYL) 20 MG tablet Take 1 tablet (20 mg total) by mouth 2 (two) times  daily. 12/25/17   Cathie Hoops, Amy V, PA-C  HYDROcodone-acetaminophen (NORCO/VICODIN) 5-325 MG tablet Take 1 tablet by mouth every 6 (six) hours as needed for severe pain. 09/20/19   Caccavale, Sophia, PA-C  ibuprofen (ADVIL) 800 MG tablet Take 1 tablet (800 mg total) by mouth 3 (three) times daily. 08/04/19   Wurst, [place], PA-C  meloxicam (MOBIC) 15 MG tablet Take 1 tablet (15 mg total) by mouth daily. 01/22/18   Cathie Hoops, Amy V, PA-C  ondansetron (ZOFRAN ODT) 4 MG disintegrating tablet Take 1 tablet (4 mg total) by mouth every 8 (eight) hours as needed for nausea or vomiting. 10/19/18   Georgetta Haber, NP  ondansetron (ZOFRAN) 4 MG tablet Take 1 tablet (4 mg total) by mouth every 8 (eight) hours as needed for nausea or vomiting. 09/20/19   Caccavale, Sophia, PA-C  tamsulosin (FLOMAX) 0.4 MG CAPS capsule Take 1 capsule (0.4 mg total) by mouth daily. 09/20/19   Caccavale, Sophia, PA-C  topiramate (TOPAMAX) 50 MG tablet Take 1 tablet (50 mg total) by mouth at bedtime. Patient not taking: Reported on 12/25/2017 10/23/17   Elvina Sidle, MD  traMADol-acetaminophen (ULTRACET) 37.5-325 MG tablet Take 1-2 tablets by mouth every 6 (six) hours as needed for severe pain. Patient not taking: Reported on 12/25/2017 03/18/17   Ofilia Neas, PA-C    Allergies  Patient has no known allergies.  Review of Systems   Review of Systems  Constitutional: Positive for chills, diaphoresis and fever. Negative for activity change and appetite change.  Respiratory: Negative for chest tightness and shortness of breath.   Gastrointestinal: Positive for diarrhea and nausea. Negative for abdominal pain, blood in stool and vomiting.  Genitourinary: Positive for dysuria, flank pain and urgency. Negative for decreased urine volume, difficulty urinating, discharge, frequency, hematuria, penile swelling, scrotal swelling and testicular pain.  Musculoskeletal: Negative for back pain and myalgias.  Skin: Negative for rash.  Neurological: Negative  for dizziness, weakness and numbness.  Hematological: Negative for adenopathy.  Psychiatric/Behavioral: Negative for confusion.    Physical Exam Updated Vital Signs BP 130/86   Pulse 78   Temp 99 F (37.2 C) (Oral)   Resp 18   Ht 6\' 1"  (1.854 m)   Wt 79.4 kg   SpO2 98%   BMI 23.09 kg/m   Physical Exam Vitals and nursing note reviewed.  Constitutional:      Appearance: Normal appearance. He is not ill-appearing or toxic-appearing.     Comments: Patient is uncomfortable appearing  HENT:     Head: Normocephalic.  Cardiovascular:     Rate and Rhythm: Normal rate and regular rhythm.     Pulses: Normal pulses.  Pulmonary:     Effort: Pulmonary effort is normal.     Breath sounds: Normal breath sounds.  Abdominal:     Palpations: Abdomen is soft.     Tenderness: There is abdominal tenderness. There is no guarding or rebound.     Comments: Mild tenderness to palpation along the left lower quadrant.  No guarding or rebound tenderness.  Abdomen is soft.  No peritoneal signs.  No CVA tenderness.  Musculoskeletal:        General: No tenderness.     Cervical back: Normal range of motion and neck supple.     Lumbar back: No edema or bony tenderness. Normal range of motion. Positive left straight leg raise test.     Comments: Tenderness to palpation of the left lower lumbar paraspinal muscles.  Positive straight leg raise on the left.  Lymphadenopathy:     Cervical: No cervical adenopathy.  Skin:    General: Skin is warm.     Capillary Refill: Capillary refill takes less than 2 seconds.     Findings: No erythema or rash.  Neurological:     Mental Status: He is alert and oriented to person, place, and time.  Psychiatric:        Judgment: Judgment normal.     ED Results / Procedures / Treatments   Labs (all labs ordered are listed, but only abnormal results are displayed) Labs Reviewed  URINALYSIS, ROUTINE W REFLEX MICROSCOPIC - Abnormal; Notable for the following components:        Result Value   Specific Gravity, Urine 1.004 (*)    All other components within normal limits  BASIC METABOLIC PANEL - Abnormal; Notable for the following components:   Glucose, Bld 106 (*)    All other components within normal limits  URINE CULTURE  CBC  HEPATIC FUNCTION PANEL    EKG None  Radiology CT ABDOMEN PELVIS W CONTRAST  Result Date: 10/12/2019 CLINICAL DATA:  Left flank pain hematuria EXAM: CT ABDOMEN AND PELVIS WITH CONTRAST TECHNIQUE: Multidetector CT imaging of the abdomen and pelvis was performed using the standard protocol following bolus administration of intravenous contrast. CONTRAST:  10/14/2019 OMNIPAQUE IOHEXOL 300 MG/ML  SOLN  COMPARISON:  CT 09/20/2019 FINDINGS: Lower chest: Lung bases demonstrate no consolidation or effusion. Bandlike density in the posterior left lower lobe likely atelectasis. Heart size within normal limits. Hepatobiliary: Subcentimeter hypodensities in the liver too small to further characterize. No calcified gallstone or biliary dilatation. Pancreas: Unremarkable. No pancreatic ductal dilatation or surrounding inflammatory changes. Spleen: Borderline enlarged Adrenals/Urinary Tract: Adrenal glands are normal. Punctate stone lower pole right kidney. No hydronephrosis. The bladder is normal Stomach/Bowel: Stomach is nonenlarged. No dilated small bowel. Negative appendix. Possible mild wall thickening at the splenic flexure and proximal descending colon. Vascular/Lymphatic: No significant vascular findings are present. No enlarged abdominal or pelvic lymph nodes. Reproductive: Prostate is unremarkable. Other: No abdominal wall hernia or abnormality. No abdominopelvic ascites. 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 IMPRESSION: 1. Negative for hydronephrosis. Punctate nonobstructing stone lower pole right kidney. 2. Collapsed appearance versus mild colitis type changes of the splenic flexure and proximal  descending colon 3. Borderline to mild enlargement of the spleen 4. 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 Electronically Signed   By: Donavan Foil M.D.   On: 10/12/2019 19:48    Procedures Procedures (including critical care time)  Medications Ordered in ED Medications  ondansetron (ZOFRAN) injection 4 mg (4 mg Intravenous Given 10/12/19 1856)  morphine 4 MG/ML injection 4 mg (4 mg Intravenous Given 10/12/19 1856)  iohexol (OMNIPAQUE) 300 MG/ML solution 100 mL (100 mLs Intravenous Contrast Given 10/12/19 1917)  HYDROmorphone (DILAUDID) injection 0.5 mg (0.5 mg Intravenous Given 10/12/19 2028)  ciprofloxacin (CIPRO) tablet 500 mg (500 mg Oral Given 10/12/19 2028)  metroNIDAZOLE (FLAGYL) tablet 500 mg (500 mg Oral Given 10/12/19 2028)    ED Course  I have reviewed the triage vital signs and the nursing notes.  Pertinent labs & imaging results that were available during my care of the patient were reviewed by me and considered in my medical decision making (see chart for details).    MDM Rules/Calculators/A&P                      Pt with 3-week history of left flank and left lower quadrant pain.  Subjective fever at home.  No vomiting, but does report diarrhea without melena or hematochezia.  He is nontoxic-appearing.  CT of the abdomen and pelvis with contrast shows likely mild colitis.  He appears appropriate for discharge home, will provide prescription for Cipro and Flagyl as well as referral information for GI.  Patient agrees to plan and return precautions were also discussed.   Final Clinical Impression(s) / ED Diagnoses Final diagnoses:  Colitis    Rx / DC Orders ED Discharge Orders    None       Bufford Lope 10/12/19 2043    Hayden Rasmussen, MD 10/13/19 1038

## 2019-10-12 NOTE — Discharge Instructions (Signed)
Your CT scan this evening shows a mild colitis.  This is treated with antibiotics.  It is important that you take the medication as directed until it is finished.  Follow-up with the gastroenterologist listed.  I have also listed a primary care provider for you to contact to establish primary care.  Return to the emergency department for any worsening symptoms such as increasing pain, persistent vomiting or or high fever.

## 2019-10-13 LAB — URINE CULTURE
Culture: NO GROWTH
Special Requests: NORMAL

## 2019-11-08 ENCOUNTER — Ambulatory Visit
Admission: EM | Admit: 2019-11-08 | Discharge: 2019-11-08 | Disposition: A | Discharge status: Home / Self Care | Payer: 59 | Attending: Emergency Medicine | Admitting: Emergency Medicine

## 2019-11-08 ENCOUNTER — Other Ambulatory Visit: Payer: Self-pay

## 2019-11-08 DIAGNOSIS — Z1152 Encounter for screening for COVID-19: Secondary | ICD-10-CM

## 2019-11-08 DIAGNOSIS — Z20822 Contact with and (suspected) exposure to covid-19: Secondary | ICD-10-CM

## 2019-11-08 LAB — POC SARS CORONAVIRUS 2 AG -  ED: SARS Coronavirus 2 Ag: NEGATIVE

## 2019-11-08 MED ORDER — CETIRIZINE HCL 10 MG PO TABS: 10.0000 mg | ORAL_TABLET | Freq: Every day | ORAL | 0 refills | Status: DC

## 2019-11-08 MED ORDER — FLUTICASONE PROPIONATE 50 MCG/ACT NA SUSP: 1.0000 | Freq: Every day | NASAL | 0 refills | Status: DC

## 2019-11-08 NOTE — ED Provider Notes (Signed)
[facility]    CSN: [id number] Arrival date & time: 11/08/19  1630      History   Chief Complaint Chief Complaint  Patient presents with  . Fever  . Chills    HPI [facility]att is a 39 y.o. male.   Who presents to the urgent care for complaint of chills, fever,  runny nose and headache for the past 1 day.  Denies sick exposure to COVID, flu or strep.  Denies recent travel.  Denies aggravating or alleviating symptoms.  Denies previous COVID infection.   Denies fatigue,  sore throat, cough, SOB, wheezing, chest pain, nausea, vomiting, changes in bowel or bladder habits.    The history is provided by the patient. No language interpreter was used.  Fever Associated symptoms: chills, congestion, headaches and rhinorrhea     History reviewed. No pertinent past medical history.  There are no problems to display for this patient.   Past Surgical History:  Procedure Laterality Date  . HAND SURGERY  2012   L wrist, cut 8 tendons, 1 nerve and main artery       Home Medications    Prior to Admission medications   Medication Sig Start Date End Date Taking? Authorizing Provider  cetirizine (ZYRTEC ALLERGY) 10 MG tablet Take 1 tablet (10 mg total) by mouth daily. 11/08/19   Chelly Dombeck, Zachery Dakins, FNP  ciprofloxacin (CIPRO) 500 MG tablet Take 1 tablet (500 mg total) by mouth 2 (two) times daily. 10/12/19   Triplett, Tammy, PA-C  fluticasone (FLONASE) 50 MCG/ACT nasal spray Place 1 spray into both nostrils daily for 14 days. 11/08/19 11/22/19  Derisha Funderburke, Zachery Dakins, FNP  metroNIDAZOLE (FLAGYL) 500 MG tablet Take 1 tablet (500 mg total) by mouth 3 (three) times daily. 10/12/19   Triplett, Tammy, PA-C  oxyCODONE-acetaminophen (PERCOCET/ROXICET) 5-325 MG tablet Take 1 tablet by mouth every 4 (four) hours as needed. 10/12/19   Pauline Aus, PA-C    Family History Family History  Problem Relation Age of Onset  . Healthy Mother   . Healthy Father     Social History Social  History   Tobacco Use  . Smoking status: Current Every Day Smoker    Packs/day: 0.50    Types: Cigarettes  . Smokeless tobacco: Never Used  Substance Use Topics  . Alcohol use: No  . Drug use: No     Allergies   Patient has no known allergies.   Review of Systems Review of Systems  Constitutional: Positive for chills and fever.  HENT: Positive for congestion and rhinorrhea.   Respiratory: Negative.   Cardiovascular: Negative.   Gastrointestinal: Negative.   Neurological: Positive for headaches.     Physical Exam Triage Vital Signs ED Triage Vitals  Enc Vitals Group     BP 11/08/19 1651 (!) 140/93     Pulse Rate 11/08/19 1651 (!) 103     Resp 11/08/19 1651 18     Temp 11/08/19 1651 98.3 F (36.8 C)     Temp src --      SpO2 11/08/19 1651 93 %     Weight --      Height --      Head Circumference --      Peak Flow --      Pain Score 11/08/19 1649 0     Pain Loc --      Pain Edu? --      Excl. in GC? --    No data found.  Updated Vital Signs  BP (!) 140/93   Pulse (!) 103   Temp 98.3 F (36.8 C)   Resp 18   SpO2 93%   Visual Acuity Right Eye Distance:   Left Eye Distance:   Bilateral Distance:    Right Eye Near:   Left Eye Near:    Bilateral Near:     Physical Exam Vitals and nursing note reviewed.  Constitutional:      General: He is not in acute distress.    Appearance: Normal appearance. He is normal weight. He is not ill-appearing or toxic-appearing.  HENT:     Head: Normocephalic.     Right Ear: Tympanic membrane, ear canal and external ear normal. There is no impacted cerumen.     Left Ear: Tympanic membrane, ear canal and external ear normal. There is no impacted cerumen.     Nose: Nose normal. No congestion.     Mouth/Throat:     Mouth: Mucous membranes are moist.     Pharynx: Oropharynx is clear. No oropharyngeal exudate or posterior oropharyngeal erythema.  Cardiovascular:     Rate and Rhythm: Normal rate and regular rhythm.      Pulses: Normal pulses.     Heart sounds: Normal heart sounds. No murmur.  Pulmonary:     Effort: Pulmonary effort is normal. No respiratory distress.     Breath sounds: Normal breath sounds. No wheezing or rhonchi.  Chest:     Chest wall: No tenderness.  Abdominal:     General: Abdomen is flat. Bowel sounds are normal. There is no distension.     Palpations: There is no mass.     Tenderness: There is no abdominal tenderness.  Skin:    Capillary Refill: Capillary refill takes less than 2 seconds.  Neurological:     General: No focal deficit present.     Mental Status: He is alert and oriented to person, place, and time.      UC Treatments / Results  Labs (all labs ordered are listed, but only abnormal results are displayed) Labs Reviewed  POC SARS CORONAVIRUS 2 AG -  ED    EKG   Radiology No results found.  Procedures Procedures (including critical care time)  Medications Ordered in UC Medications - No data to display  Initial Impression / Assessment and Plan / UC Course  I have reviewed the triage vital signs and the nursing notes.  Pertinent labs & imaging results that were available during my care of the patient were reviewed by me and considered in my medical decision making (see chart for details).    Patient is stable at discharge. POCT COVID-19 test was negative [facility] COVID-19 test was ordered Advised patient to quarantine Flonase and zyrtec was prescribed Work note was given Return or go to ED for worsening of symptoms  Final Clinical Impressions(s) / UC Diagnoses   Final diagnoses:  Encounter for screening for COVID-19     Discharge Instructions     COVID testing ordered.  It will take between 2-7 days for test results.  Someone will contact you regarding abnormal results.    In the meantime: You should remain isolated in your home for 10 days from symptom onset AND greater than 24 hours after symptoms resolution (absence of fever without the  use of fever-reducing medication and improvement in respiratory symptoms), whichever is longer Get plenty of rest and push fluids Zyrtec-D prescribed for nasal congestion, runny nose, and/or sore throat Flonase prescribed for nasal congestion and runny nose Use  medications daily for symptom relief Use OTC medications like ibuprofen or tylenol as needed fever or pain Call or go to the ED if you have any new or worsening symptoms such as fever, worsening cough, shortness of breath, chest tightness, chest pain, turning blue, changes in mental status, etc...     ED Prescriptions    Medication Sig Dispense Auth. Provider   fluticasone (FLONASE) 50 MCG/ACT nasal spray Place 1 spray into both nostrils daily for 14 days. 16 g Aiana Nordquist, Darrelyn Hillock, FNP   cetirizine (ZYRTEC ALLERGY) 10 MG tablet Take 1 tablet (10 mg total) by mouth daily. 30 tablet Arend Bahl, Darrelyn Hillock, FNP     PDMP not reviewed this encounter.   Emerson Monte, [place] 11/08/19 1734

## 2019-11-08 NOTE — Discharge Instructions (Addendum)

## 2019-11-08 NOTE — ED Triage Notes (Signed)
Pt presents with c/o fever and chills that began last night. Pt state fever was 100.3 also has runny nose

## 2019-11-09 LAB — SARS-COV-2, NAA 2 DAY TAT

## 2019-11-09 LAB — NOVEL CORONAVIRUS, NAA: SARS-CoV-2, NAA: NOT DETECTED

## 2019-11-12 ENCOUNTER — Other Ambulatory Visit: Payer: Self-pay

## 2019-11-12 ENCOUNTER — Ambulatory Visit (HOSPITAL_COMMUNITY)
Admission: EM | Admit: 2019-11-12 | Discharge: 2019-11-12 | Disposition: A | Discharge status: Home / Self Care | Payer: 59

## 2019-11-12 ENCOUNTER — Encounter (HOSPITAL_COMMUNITY): Payer: Self-pay

## 2019-11-12 DIAGNOSIS — T148XXA Other injury of unspecified body region, initial encounter: Secondary | ICD-10-CM

## 2019-11-12 DIAGNOSIS — S1191XA Laceration without foreign body of unspecified part of neck, initial encounter: Secondary | ICD-10-CM

## 2019-11-12 NOTE — ED Provider Notes (Signed)
[place]   MRN: [id number] DOB: 12/28/1980  Subjective:   [facility]att is a 39 y.o. male presenting for suffering an accidental fall in the kitchen this morning.  Patient states that he fell on some knives that have been cleaned the night before.  He did not lose consciousness.  His last Tdap was within the past 10 years.  Patient feels some stinging around his wound.  Denies taking chronic medications.    No Known Allergies  History reviewed. No pertinent past medical history.   Past Surgical History:  Procedure Laterality Date  . HAND SURGERY  2012   L wrist, cut 8 tendons, 1 nerve and main artery    Family History  Problem Relation Age of Onset  . Healthy Mother   . Healthy Father     Social History   Tobacco Use  . Smoking status: Current Every Day Smoker    Packs/day: 0.50    Types: Cigarettes  . Smokeless tobacco: Never Used  Substance Use Topics  . Alcohol use: No  . Drug use: No    ROS   Objective:   Vitals: BP (!) 137/94 (BP Location: Left Arm)   Pulse 84   Temp 98.3 F (36.8 C) (Oral)   Resp 18   SpO2 99%   Physical Exam Constitutional:      General: He is not in acute distress.    Appearance: Normal appearance. He is well-developed and normal weight. He is not ill-appearing, toxic-appearing or diaphoretic.  HENT:     Head: Normocephalic and atraumatic.     Right Ear: External ear normal.     Left Ear: External ear normal.     Nose: Nose normal.     Mouth/Throat:     Pharynx: Oropharynx is clear.  Eyes:     General: No scleral icterus.       Right eye: No discharge.        Left eye: No discharge.     Extraocular Movements: Extraocular movements intact.     Pupils: Pupils are equal, round, and reactive to light.  Neck:      Comments: No tracheal deviation.  Patient swallows without difficulty.  Speaking without difficulty as well.  No stridor.  There is mild pain laterally over superficial lacerations when he speaks,  swallows. Cardiovascular:     Rate and Rhythm: Normal rate.  Pulmonary:     Effort: Pulmonary effort is normal.  Musculoskeletal:     Cervical back: Normal range of motion.  Neurological:     Mental Status: He is alert and oriented to person, place, and time.     Cranial Nerves: No cranial nerve deficit.     Motor: No weakness.     Coordination: Coordination normal.     Gait: Gait normal.     Deep Tendon Reflexes: Reflexes normal.  Psychiatric:        Mood and Affect: Mood normal.        Behavior: Behavior normal.        Thought Content: Thought content normal.        Judgment: Judgment normal.    3 superficial lacerations repaired with Dermabond.  Assessment and Plan :   1. Laceration of neck, initial encounter   2. Superficial laceration     Laceration successfully approximated with Dermabond.  Counseled on wound care.  Use Tylenol and ibuprofen for pain control.  Provided with note for work for today so that he limits sweating, neck movement to  allow for healing of his lacerations and success of the Dermabond. Counseled patient on potential for adverse effects with medications prescribed/recommended today, ER and return-to-clinic precautions discussed, patient verbalized understanding.    Wallis Bamberg, [place] 11/12/19 [phone]

## 2019-11-12 NOTE — Discharge Instructions (Signed)
You may take 500mg  Tylenol with ibuprofen 400-600mg  every 6 hours for pain from your wound.

## 2019-11-12 NOTE — ED Triage Notes (Signed)
Pt states he slipped on a rug in his kitchen and fell into the dish drainer that contained sharp knives. C/o small laceration to right neck area. Three small lacerations noted, bleeding controlled at present. Pt states tetanus shot was less than 5 years ago. Denies head injury or LOC at time of injury/fall. Able to swallow with mild discomfort. Respirations equal/unlabored; no stridor present.

## 2020-01-24 ENCOUNTER — Other Ambulatory Visit: Payer: Self-pay

## 2020-01-24 ENCOUNTER — Encounter (HOSPITAL_COMMUNITY): Payer: Self-pay

## 2020-01-24 ENCOUNTER — Ambulatory Visit (INDEPENDENT_AMBULATORY_CARE_PROVIDER_SITE_OTHER): Payer: BC Managed Care – PPO

## 2020-01-24 ENCOUNTER — Ambulatory Visit (HOSPITAL_COMMUNITY)
Admission: EM | Admit: 2020-01-24 | Discharge: 2020-01-24 | Disposition: A | Discharge status: Home / Self Care | Payer: BC Managed Care – PPO | Attending: Family Medicine | Admitting: Family Medicine

## 2020-01-24 DIAGNOSIS — M7989 Other specified soft tissue disorders: Secondary | ICD-10-CM | POA: Diagnosis not present

## 2020-01-24 DIAGNOSIS — S9031XA Contusion of right foot, initial encounter: Secondary | ICD-10-CM | POA: Diagnosis not present

## 2020-01-24 DIAGNOSIS — M79671 Pain in right foot: Secondary | ICD-10-CM | POA: Diagnosis not present

## 2020-01-24 MED ORDER — IBUPROFEN 800 MG PO TABS
800.0000 mg | ORAL_TABLET | Freq: Three times a day (TID) | ORAL | 0 refills | Status: DC
Start: 2020-01-24 — End: 2020-05-05

## 2020-01-24 NOTE — Discharge Instructions (Signed)
No fractures, foot sprain/contusion- I expect gradual healing over the next 2 weeks Ice and elevate Use anti-inflammatories for pain/swelling. You may take up to 800 mg Ibuprofen every 8 hours with food. You may supplement Ibuprofen with Tylenol [phone] mg every 8 hours.   Follow up if not improving

## 2020-01-24 NOTE — ED Triage Notes (Signed)
Pt presents with right pinky toe injury after slamming it against furniture earlier today.

## 2020-01-24 NOTE — ED Provider Notes (Signed)
[facility]    CSN: [id number] Arrival date & time: 01/24/20  1534      History   Chief Complaint Chief Complaint  Patient presents with  . Toe Injury    HPI Randy Pratt is a 39 y.o. male no significant past medical history presenting today for evaluation of foot injury.  Patient accidentally jammed his foot/little toe into a wooden piece of furniture earlier today.  Since has developed pain and swelling and bruising around his fourth and fifth toes of his right foot.  Denies prior fractures to foot.  Has taken some ibuprofen for pain.  HPI  History reviewed. No pertinent past medical history.  There are no problems to display for this patient.   Past Surgical History:  Procedure Laterality Date  . HAND SURGERY  2012   L wrist, cut 8 tendons, 1 nerve and main artery       Home Medications    Prior to Admission medications   Medication Sig Start Date End Date Taking? Authorizing Provider  ibuprofen (ADVIL) 800 MG tablet Take 1 tablet (800 mg total) by mouth 3 (three) times daily. 01/24/20   Vincent Streater C, PA-C  cetirizine (ZYRTEC ALLERGY) 10 MG tablet Take 1 tablet (10 mg total) by mouth daily. 11/08/19 11/12/19  Avegno, Zachery Dakins, FNP  fluticasone (FLONASE) 50 MCG/ACT nasal spray Place 1 spray into both nostrils daily for 14 days. 11/08/19 11/12/19  Durward Parcel, FNP    Family History Family History  Problem Relation Age of Onset  . Healthy Mother   . Healthy Father     Social History Social History   Tobacco Use  . Smoking status: Current Every Day Smoker    Packs/day: 0.50    Types: Cigarettes  . Smokeless tobacco: Never Used  Substance Use Topics  . Alcohol use: No  . Drug use: No     Allergies   Patient has no known allergies.   Review of Systems Review of Systems  Constitutional: Negative for fatigue and fever.  Eyes: Negative for redness, itching and visual disturbance.  Respiratory: Negative for shortness of breath.     Cardiovascular: Negative for chest pain and leg swelling.  Gastrointestinal: Negative for nausea and vomiting.  Musculoskeletal: Positive for arthralgias, gait problem and joint swelling. Negative for myalgias.  Skin: Positive for color change. Negative for rash and wound.  Neurological: Negative for dizziness, syncope, weakness, light-headedness and headaches.     Physical Exam Triage Vital Signs ED Triage Vitals  Enc Vitals Group     BP 01/24/20 1612 (!) 143/99     Pulse Rate 01/24/20 1612 (!) 113     Resp 01/24/20 1612 20     Temp 01/24/20 1612 97.8 F (36.6 C)     Temp Source 01/24/20 1612 Oral     SpO2 01/24/20 1612 97 %     Weight --      Height --      Head Circumference --      Peak Flow --      Pain Score 01/24/20 1610 8     Pain Loc --      Pain Edu? --      Excl. in GC? --    No data found.  Updated Vital Signs BP (!) 143/99 (BP Location: Right Arm)   Pulse (!) 113   Temp 97.8 F (36.6 C) (Oral)   Resp 20   SpO2 97%   Visual Acuity Right Eye Distance:  Left Eye Distance:   Bilateral Distance:    Right Eye Near:   Left Eye Near:    Bilateral Near:     Physical Exam Vitals and nursing note reviewed.  Constitutional:      Appearance: He is well-developed.     Comments: No acute distress  HENT:     Head: Normocephalic and atraumatic.     Nose: Nose normal.  Eyes:     Conjunctiva/sclera: Conjunctivae normal.  Cardiovascular:     Rate and Rhythm: Normal rate.  Pulmonary:     Effort: Pulmonary effort is normal. No respiratory distress.  Abdominal:     General: There is no distension.  Musculoskeletal:        General: Normal range of motion.     Cervical back: Neck supple.     Comments: Right foot: Erythema and bruising noted to distal fourth and fifth metatarsals extending into fifth toe with associated swelling and tenderness to palpation  Dorsalis pedis 2+  Skin:    General: Skin is warm and dry.     Comments: Small superficial abrasion  noted to dorsum of fifth toe  Neurological:     Mental Status: He is alert and oriented to person, place, and time.      UC Treatments / Results  Labs (all labs ordered are listed, but only abnormal results are displayed) Labs Reviewed - No data to display  EKG   Radiology DG Foot Complete Right  Result Date: 01/24/2020 CLINICAL DATA:  Jammed toe. Pain, swelling, and bruising primarily involving the fifth digit. EXAM: RIGHT FOOT COMPLETE - 3+ VIEW COMPARISON:  01/22/2018 FINDINGS: No fracture or dislocation is identified. Joint space widths are preserved. There is soft tissue swelling about the fifth MTP joint. IMPRESSION: Soft tissue swelling without acute osseous abnormality. Electronically Signed   By: Logan Bores M.D.   On: 01/24/2020 16:52    Procedures Procedures (including critical care time)  Medications Ordered in UC Medications - No data to display  Initial Impression / Assessment and Plan / UC Course  I have reviewed the triage vital signs and the nursing notes.  Pertinent labs & imaging results that were available during my care of the patient were reviewed by me and considered in my medical decision making (see chart for details).     X-ray negative, treating for foot sprain/contusion.  Rest ice elevation and anti-inflammatories.  Monitor for gradual improvement.  Providing Ace wrap and postop shoe for comfort and compression.  Discussed strict return precautions. Patient verbalized understanding and is agreeable with plan.  Final Clinical Impressions(s) / UC Diagnoses   Final diagnoses:  Contusion of right foot, initial encounter     Discharge Instructions     No fractures, foot sprain/contusion- I expect gradual healing over the next 2 weeks Ice and elevate Use anti-inflammatories for pain/swelling. You may take up to 800 mg Ibuprofen every 8 hours with food. You may supplement Ibuprofen with Tylenol [phone] mg every 8 hours.   Follow up if not  improving    ED Prescriptions    Medication Sig Dispense Auth. Provider   ibuprofen (ADVIL) 800 MG tablet Take 1 tablet (800 mg total) by mouth 3 (three) times daily. 21 tablet Maddoxx Burkitt, [place] C, PA-C     PDMP not reviewed this encounter.   Janith Lima, [place] 01/24/20 1905

## 2020-01-26 ENCOUNTER — Other Ambulatory Visit: Payer: Self-pay

## 2020-01-26 ENCOUNTER — Ambulatory Visit (HOSPITAL_COMMUNITY)
Admission: EM | Admit: 2020-01-26 | Discharge: 2020-01-26 | Disposition: A | Discharge status: Home / Self Care | Payer: BC Managed Care – PPO | Attending: Family Medicine | Admitting: Family Medicine

## 2020-01-26 ENCOUNTER — Encounter (HOSPITAL_COMMUNITY): Payer: Self-pay | Admitting: Emergency Medicine

## 2020-01-26 DIAGNOSIS — S90121A Contusion of right lesser toe(s) without damage to nail, initial encounter: Secondary | ICD-10-CM

## 2020-01-26 MED ORDER — HYDROCODONE-ACETAMINOPHEN 5-325 MG PO TABS: 1.0000 | ORAL_TABLET | Freq: Four times a day (QID) | ORAL | 0 refills | Status: DC | PRN

## 2020-01-26 NOTE — Discharge Instructions (Signed)

## 2020-01-26 NOTE — ED Triage Notes (Signed)
Pt here for right pinky toe and foot pain; pt seen here recently for same; pt sts continued pain and swelling to area and needs new work note

## 2020-01-27 NOTE — ED Provider Notes (Signed)
[facility]   [id number] 01/26/20 Arrival Time: 1644  ASSESSMENT & PLAN:  1. Contusion of fifth toe of right foot, initial encounter     No indications for re-imaging of foot at this time.  Continue ibuprofen with food. Work note provided. WBAT.   Meds ordered this encounter  Medications  . HYDROcodone-acetaminophen (NORCO/VICODIN) 5-325 MG tablet    Sig: Take 1 tablet by mouth every 6 (six) hours as needed for moderate pain or severe pain.    Dispense:  8 tablet    Refill:  0      Discharge Instructions     Be aware, you have been prescribed pain medications that may cause drowsiness. While taking this medication, do not take any other medications containing acetaminophen (Tylenol). Do not combine with alcohol or other illicit drugs. Please do not drive, operate heavy machinery, or take part in activities that require making important decisions while on this medication as your judgement may be clouded.       Recommend:  Follow-up Information    Schedule an appointment as soon as possible for a visit  with [facility]).   Why: If worsening or failing to improve as anticipated. Contact information: [address] [place],  [place]  [address]  [phone]               [place] Controlled Substances Registry consulted for this patient. I feel the risk/benefit ratio today is favorable for proceeding with this prescription for a controlled substance. Medication sedation precautions given.  Reviewed expectations re: course of current medical issues. Questions answered. Outlined signs and symptoms indicating need for more acute intervention. Patient verbalized understanding. After Visit Summary given.  SUBJECTIVE: History from: patient. Randy Pratt is a 39 y.o. male who reports persistent marked pain of his right foot/5th toe; seen here on 01/24/20; note reviewed. Pain described as aching and burning; without radiation. Wearing  cast shoe. Doesn't feel he can work secondary to pain. No specific worsening of pain; just not better. Aggravating factors: weight bearing. Alleviating factors: have not been identified. Associated symptoms: none reported. Extremity sensation changes or weakness: none.  Past Surgical History:  Procedure Laterality Date  . HAND SURGERY  2012   L wrist, cut 8 tendons, 1 nerve and main artery      OBJECTIVE:  Vitals:   01/26/20 1714  BP: (!) 137/95  Pulse: (!) 115  Resp: 18  Temp: 99.3 F (37.4 C)  TempSrc: Oral  SpO2: 99%    General appearance: alert; no distress HEENT: [place]; AT Neck: supple with FROM Resp: unlabored respirations Extremities: . RLE: warm with well perfused appearance; fairly well localized marked tenderness over right foot around 5th toe; without gross deformities; swelling: moderate CV: brisk extremity capillary refill of RLE; 2+ DP pulse of RLE. Skin: warm and dry; no visible rashes Neurologic: normal sensation of RLE Psychological: alert and cooperative; normal mood and affect   No Known Allergies  History reviewed. No pertinent past medical history. Social History   Socioeconomic History  . Marital status: Married    Spouse name: Not on file  . Number of children: Not on file  . Years of education: Not on file  . Highest education level: Not on file  Occupational History  . Not on file  Tobacco Use  . Smoking status: Current Every Day Smoker    Packs/day: 0.50    Types: Cigarettes  . Smokeless tobacco: Never Used  Substance and Sexual  Activity  . Alcohol use: No  . Drug use: No  . Sexual activity: Yes    Birth control/protection: Condom  Other Topics Concern  . Not on file  Social History Narrative  . Not on file   Social Determinants of Health   Financial Resource Strain:   . Difficulty of Paying Living Expenses:   Food Insecurity:   . Worried About Charity fundraiser in the Last Year:   . Arboriculturist in the Last Year:     Transportation Needs:   . Film/video editor (Medical):   Marland Kitchen Lack of Transportation (Non-Medical):   Physical Activity:   . Days of Exercise per Week:   . Minutes of Exercise per Session:   Stress:   . Feeling of Stress :   Social Connections:   . Frequency of Communication with Friends and Family:   . Frequency of Social Gatherings with Friends and Family:   . Attends Religious Services:   . Active Member of Clubs or Organizations:   . Attends Archivist Meetings:   Marland Kitchen Marital Status:    Family History  Problem Relation Age of Onset  . Healthy Mother   . Healthy Father    Past Surgical History:  Procedure Laterality Date  . HAND SURGERY  2012   L wrist, cut 8 tendons, 1 nerve and main artery      Vanessa Kick, MD 01/27/20 1001

## 2020-01-31 ENCOUNTER — Ambulatory Visit
Admission: EM | Admit: 2020-01-31 | Discharge: 2020-01-31 | Disposition: A | Discharge status: Home / Self Care | Payer: BC Managed Care – PPO | Attending: Emergency Medicine | Admitting: Emergency Medicine

## 2020-01-31 ENCOUNTER — Encounter: Payer: Self-pay | Admitting: Emergency Medicine

## 2020-01-31 DIAGNOSIS — L03213 Periorbital cellulitis: Secondary | ICD-10-CM | POA: Diagnosis not present

## 2020-01-31 MED ORDER — SULFAMETHOXAZOLE-TRIMETHOPRIM 800-160 MG PO TABS: 1.0000 | ORAL_TABLET | Freq: Two times a day (BID) | ORAL | 0 refills | Status: AC

## 2020-01-31 NOTE — ED Provider Notes (Signed)
[facility]   [id number] 01/31/20 Arrival Time: 1736  Chief Complaint  Patient presents with  . Eye Problem     SUBJECTIVE:  Spyros Winch is a 39 y.o. male who presents to the urgent care with a complaint of right  eye swelling that was noticed this morning.  Denies a precipitating event, trauma, or close contacts with similar symptoms.  Has tried ice with mild relief.  Denies alleviating factors.  Denies similar symptoms in the past.  Denies fever, chills, nausea, vomiting, eye pain, painful eye movements, halos, discharge, itching, vision changes, double vision, FB sensation, periorbital erythema.     Denies contact lens use.    ROS: As per HPI.  All other pertinent ROS negative.     History reviewed. No pertinent past medical history. Past Surgical History:  Procedure Laterality Date  . HAND SURGERY  2012   L wrist, cut 8 tendons, 1 nerve and main artery   No Known Allergies No current facility-administered medications on file prior to encounter.   Current Outpatient Medications on File Prior to Encounter  Medication Sig Dispense Refill  . HYDROcodone-acetaminophen (NORCO/VICODIN) 5-325 MG tablet Take 1 tablet by mouth every 6 (six) hours as needed for moderate pain or severe pain. 8 tablet 0  . ibuprofen (ADVIL) 800 MG tablet Take 1 tablet (800 mg total) by mouth 3 (three) times daily. 21 tablet 0  . [DISCONTINUED] cetirizine (ZYRTEC ALLERGY) 10 MG tablet Take 1 tablet (10 mg total) by mouth daily. 30 tablet 0  . [DISCONTINUED] fluticasone (FLONASE) 50 MCG/ACT nasal spray Place 1 spray into both nostrils daily for 14 days. 16 g 0   Social History   Socioeconomic History  . Marital status: Married    Spouse name: Not on file  . Number of children: Not on file  . Years of education: Not on file  . Highest education level: Not on file  Occupational History  . Not on file  Tobacco Use  . Smoking status: Current Every Day Smoker    Packs/day: 0.50    Types:  Cigarettes  . Smokeless tobacco: Never Used  Substance and Sexual Activity  . Alcohol use: No  . Drug use: No  . Sexual activity: Yes    Birth control/protection: Condom  Other Topics Concern  . Not on file  Social History Narrative  . Not on file   Social Determinants of Health   Financial Resource Strain:   . Difficulty of Paying Living Expenses:   Food Insecurity:   . Worried About Programme researcher, broadcasting/film/video in the Last Year:   . Barista in the Last Year:   Transportation Needs:   . Freight forwarder (Medical):   Marland Kitchen Lack of Transportation (Non-Medical):   Physical Activity:   . Days of Exercise per Week:   . Minutes of Exercise per Session:   Stress:   . Feeling of Stress :   Social Connections:   . Frequency of Communication with Friends and Family:   . Frequency of Social Gatherings with Friends and Family:   . Attends Religious Services:   . Active Member of Clubs or Organizations:   . Attends Banker Meetings:   Marland Kitchen Marital Status:   Intimate Partner Violence:   . Fear of Current or Ex-Partner:   . Emotionally Abused:   Marland Kitchen Physically Abused:   . Sexually Abused:    Family History  Problem Relation Age of Onset  . Healthy Mother   .  Healthy Father     OBJECTIVE:    Visual Acuity  Right Eye Distance: 20/30 Left Eye Distance: 20/30 Bilateral Distance: 20/30  Right Eye Near:   Left Eye Near:    Bilateral Near:      Vitals:   01/31/20 1744 01/31/20 1745  BP:  (!) 148/97  Pulse:  (!) 109  Resp:  18  Temp:  98.9 F (37.2 C)  TempSrc:  Oral  SpO2:  95%  Weight: 173 lb (78.5 kg)   Height: 6\' 1"  (1.854 m)     Physical Exam Vitals and nursing note reviewed.  Constitutional:      General: He is not in acute distress.    Appearance: Normal appearance. He is normal weight. He is not ill-appearing, toxic-appearing or diaphoretic.  Eyes:     General: Lids are normal. Lids are everted, no foreign bodies appreciated. Gaze aligned  appropriately.        Right eye: No foreign body, discharge or hordeolum.        Left eye: No foreign body, discharge or hordeolum.     Extraocular Movements: Extraocular movements intact.     Conjunctiva/sclera:     Right eye: Right conjunctiva is injected. No exudate or hemorrhage.    Comments: Periorbital edema with erythema  Cardiovascular:     Rate and Rhythm: Normal rate and regular rhythm.     Pulses: Normal pulses.     Heart sounds: Normal heart sounds. No murmur heard.  No friction rub. No gallop.   Pulmonary:     Effort: Pulmonary effort is normal. No respiratory distress.     Breath sounds: Normal breath sounds. No stridor. No wheezing, rhonchi or rales.  Chest:     Chest wall: No tenderness.  Neurological:     Mental Status: He is alert.     ASSESSMENT & PLAN:  1. Periorbital cellulitis of right eye     Meds ordered this encounter  Medications  . sulfamethoxazole-trimethoprim (BACTRIM DS) 800-160 MG tablet    Sig: Take 1 tablet by mouth 2 (two) times daily for 7 days.    Dispense:  14 tablet    Refill:  0     Discharge instructions Bactrim DS prescribed and to completion Use OTC systane or genteal gel eye drops at night as needed for symptomatic relief Use OTC ibuprofen or tylenol as needed for pain relief Return here or follow up with ophthamolgy if symptoms persists or worsen such as fever, chills, redness, swelling, eye pain, painful eye movements, vision changes, etc...  Reviewed expectations re: course of current medical issues. Questions answered. Outlined signs and symptoms indicating need for more acute intervention. Patient verbalized understanding. After Visit Summary given.   Emerson Monte, [place] 01/31/20 1804

## 2020-01-31 NOTE — Discharge Instructions (Addendum)
Bactrim DS prescribed and to completion Use OTC systane or genteal gel eye drops at night as needed for symptomatic relief Use OTC ibuprofen or tylenol as needed for pain relief Return here or follow up with ophthamolgy if symptoms persists or worsen such as fever, chills, redness, swelling, eye pain, painful eye movements, vision changes, etc..

## 2020-01-31 NOTE — ED Triage Notes (Signed)
Pt woke up this morning and his RT eye was swollen shut and near the RT side of his nose.   Pt denies any drainage of injury.  Was told by his employer to come get it checked out.

## 2020-05-05 ENCOUNTER — Ambulatory Visit (HOSPITAL_COMMUNITY)
Admission: EM | Admit: 2020-05-05 | Discharge: 2020-05-05 | Disposition: A | Discharge status: Home / Self Care | Payer: Self-pay | Attending: Emergency Medicine | Admitting: Emergency Medicine

## 2020-05-05 ENCOUNTER — Ambulatory Visit (INDEPENDENT_AMBULATORY_CARE_PROVIDER_SITE_OTHER): Payer: Self-pay

## 2020-05-05 ENCOUNTER — Encounter (HOSPITAL_COMMUNITY): Payer: Self-pay

## 2020-05-05 ENCOUNTER — Other Ambulatory Visit: Payer: Self-pay

## 2020-05-05 DIAGNOSIS — S6991XA Unspecified injury of right wrist, hand and finger(s), initial encounter: Secondary | ICD-10-CM

## 2020-05-05 MED ORDER — IBUPROFEN 800 MG PO TABS
ORAL_TABLET | ORAL | Status: AC
  Filled 2020-05-05: qty 1

## 2020-05-05 MED ORDER — HYDROCODONE-ACETAMINOPHEN 5-325 MG PO TABS: 1.0000 | ORAL_TABLET | Freq: Four times a day (QID) | ORAL | 0 refills | Status: DC | PRN

## 2020-05-05 MED ORDER — IBUPROFEN 800 MG PO TABS
800.0000 mg | ORAL_TABLET | Freq: Three times a day (TID) | ORAL | 0 refills | Status: DC | PRN
Start: 2020-05-05 — End: 2022-12-20

## 2020-05-05 MED ORDER — IBUPROFEN 800 MG PO TABS
800.0000 mg | ORAL_TABLET | Freq: Once | ORAL | Status: AC
  Administered 2020-05-05: 800 mg via ORAL

## 2020-05-05 NOTE — ED Triage Notes (Signed)
Pt states he fell backwards and tried to catch himself with his hands and injured his right wrist yesterday. Pt 10/10 sharp pain in right wrist. Pt has 2+ edema of right hand and wrist, 2+ right radial pulse, cap refill less than 3 sec, decreased ROM.

## 2020-05-05 NOTE — Discharge Instructions (Signed)
There is not any obvious fracture, there is one small area that is maybe related to a fracture, however.  Use the brace provided at all times.  Ice, elevation, ibuprofen every 8 hours for pain management. Hydrocodone for breakthrough pain. May cause drowsiness. Please do not take if driving or drinking alcohol.   Follow up with Dr. Melvyn Novas for recheck.

## 2020-05-05 NOTE — ED Provider Notes (Signed)
[facility]    CSN: [id number] Arrival date & time: 05/05/20  1835      History   Chief Complaint Chief Complaint  Patient presents with  . Wrist Injury    HPI Randy Pratt is a 39 y.o. male.   Randy Pratt presents with complaints of right wrist pain. States he was playing around yesterday, lost his footing while walking backward, and tripped falling backwards, catching himself with right arm/ wrist. Progressive pain since. Constant throb. He is right handed. Pain with movement. Worse with extension. Took ibuprofen and tylenol which haven't helped. No numbness or tingling to fingers. Landed on grass surface. No previous similar injury.   ROS per HPI, negative if not otherwise mentioned.      History reviewed. No pertinent past medical history.  There are no problems to display for this patient.   Past Surgical History:  Procedure Laterality Date  . HAND SURGERY  2012   L wrist, cut 8 tendons, 1 nerve and main artery       Home Medications    Prior to Admission medications   Medication Sig Start Date End Date Taking? Authorizing Provider  HYDROcodone-acetaminophen (NORCO/VICODIN) 5-325 MG tablet Take 1 tablet by mouth every 6 (six) hours as needed for moderate pain or severe pain. 05/05/20   Georgetta Haber, NP  ibuprofen (ADVIL) 800 MG tablet Take 1 tablet (800 mg total) by mouth every 8 (eight) hours as needed. 05/05/20   Georgetta Haber, NP  cetirizine (ZYRTEC ALLERGY) 10 MG tablet Take 1 tablet (10 mg total) by mouth daily. 11/08/19 11/12/19  Avegno, Zachery Dakins, FNP  fluticasone (FLONASE) 50 MCG/ACT nasal spray Place 1 spray into both nostrils daily for 14 days. 11/08/19 11/12/19  Durward Parcel, FNP    Family History Family History  Problem Relation Age of Onset  . Healthy Mother   . Healthy Father     Social History Social History   Tobacco Use  . Smoking status: Current Every Day Smoker    Packs/day: 0.50    Types: Cigarettes  .  Smokeless tobacco: Never Used  Substance Use Topics  . Alcohol use: No  . Drug use: No     Allergies   Patient has no known allergies.   Review of Systems Review of Systems   Physical Exam Triage Vital Signs ED Triage Vitals  Enc Vitals Group     BP 05/05/20 1953 (!) 140/92     Pulse Rate 05/05/20 1953 100     Resp 05/05/20 1953 16     Temp 05/05/20 1953 98.9 F (37.2 C)     Temp Source 05/05/20 1953 Oral     SpO2 05/05/20 1953 100 %     Weight 05/05/20 1954 175 lb (79.4 kg)     Height 05/05/20 1954 6\' 1"  (1.854 m)     Head Circumference --      Peak Flow --      Pain Score 05/05/20 1954 10     Pain Loc --      Pain Edu? --      Excl. in GC? --    No data found.  Updated Vital Signs BP (!) 140/92   Pulse 100   Temp 98.9 F (37.2 C) (Oral)   Resp 16   Ht 6\' 1"  (1.854 m)   Wt 175 lb (79.4 kg)   SpO2 100%   BMI 23.09 kg/m   Visual Acuity Right Eye Distance:   Left Eye  Distance:   Bilateral Distance:    Right Eye Near:   Left Eye Near:    Bilateral Near:     Physical Exam Constitutional:      Appearance: He is well-developed.  Cardiovascular:     Rate and Rhythm: Normal rate.  Pulmonary:     Effort: Pulmonary effort is normal.  Musculoskeletal:     Right wrist: Swelling, tenderness, bony tenderness and snuff box tenderness present. No deformity, effusion or lacerations. Decreased range of motion.     Comments: Gross sensation intact; pain primarily at distal radius, snuff box, proximal first metacarpal with swelling noted   Skin:    General: Skin is warm and dry.  Neurological:     Mental Status: He is alert and oriented to person, place, and time.      UC Treatments / Results  Labs (all labs ordered are listed, but only abnormal results are displayed) Labs Reviewed - No data to display  EKG   Radiology DG Wrist Complete Right  Result Date: 05/05/2020 CLINICAL DATA:  Pain after fall yesterday. EXAM: RIGHT WRIST - COMPLETE 3+ VIEW  COMPARISON:  None. FINDINGS: Soft tissue swelling over the dorsum of the wrist. There is a lucency through 1 of the carpal bones only seen on the lateral view. This could be the triquetrum or lunate. Deformity of the distal fifth metacarpal is consistent with a chronic healed fracture. IMPRESSION: There is soft tissue swelling over the dorsum of the wrist. There is a subtle lucency extending through 1 of the carpal bones only seen on the lateral view, either the lunate or triquetrum. While this could represent a prominent nutrient foramen, a subtle fracture cannot be excluded given the overlying soft tissue swelling. Recommend clinical correlation. An MRI or CT scan could better evaluate this region. Electronically Signed   By: Gerome Sam III M.D   On: 05/05/2020 21:56    Procedures Procedures (including critical care time)  Medications Ordered in UC Medications  ibuprofen (ADVIL) tablet 800 mg (800 mg Oral Given 05/05/20 2111)    Initial Impression / Assessment and Plan / UC Course  I have reviewed the triage vital signs and the nursing notes.  Pertinent labs & imaging results that were available during my care of the patient were reviewed by me and considered in my medical decision making (see chart for details).     Subtle fracture on xray can't be excluded, s/p fall yesterday with worsening of all. Thumb spica placed. Pain management and follow up recommendations provided. Patient verbalized understanding and agreeable to plan.    Final Clinical Impressions(s) / UC Diagnoses   Final diagnoses:  Injury of right wrist, initial encounter     Discharge Instructions     There is not any obvious fracture, there is one small area that is maybe related to a fracture, however.  Use the brace provided at all times.  Ice, elevation, ibuprofen every 8 hours for pain management. Hydrocodone for breakthrough pain. May cause drowsiness. Please do not take if driving or drinking alcohol.     Follow up with Dr. Melvyn Novas for recheck.     ED Prescriptions    Medication Sig Dispense Auth. Provider   ibuprofen (ADVIL) 800 MG tablet Take 1 tablet (800 mg total) by mouth every 8 (eight) hours as needed. 30 tablet Linus Mako B, NP   HYDROcodone-acetaminophen (NORCO/VICODIN) 5-325 MG tablet Take 1 tablet by mouth every 6 (six) hours as needed for moderate pain or severe pain.  8 tablet Linus Mako B, NP     I have reviewed the PDMP during this encounter.   Georgetta Haber, NP 05/06/20 [phone]

## 2021-06-10 IMAGING — CT CT ABD-PELV W/ CM
2 of 4 series · 15 of 46 positions shown, 17 images · IV contrast (Omnipaque or Isovue)
Comparison: CT 09/20/2019

CLINICAL DATA: Left flank pain hematuria

EXAM:
CT ABDOMEN AND PELVIS WITH CONTRAST
TECHNIQUE: Multidetector CT imaging of the abdomen and pelvis was performed
using the standard protocol following bolus administration of
intravenous contrast.
CONTRAST:  100mL OMNIPAQUE IOHEXOL 300 MG/ML  SOLN

[Series 2: axial st · axial · 0.68mm/px · z∈[-710,-305]mm · 12 of 93 slices shown, 14 images]
[im 6/93  soft-tissue]
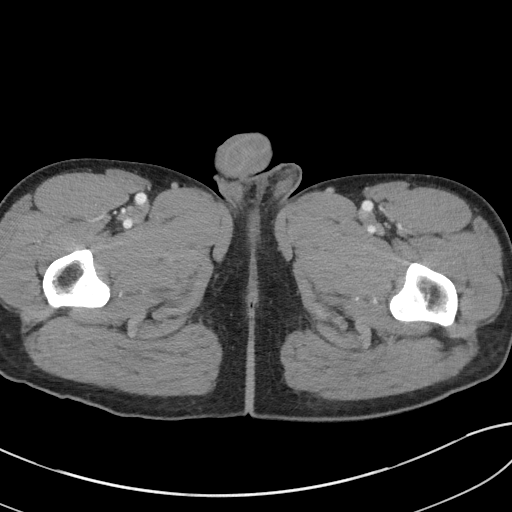
[im 6/93  bone]
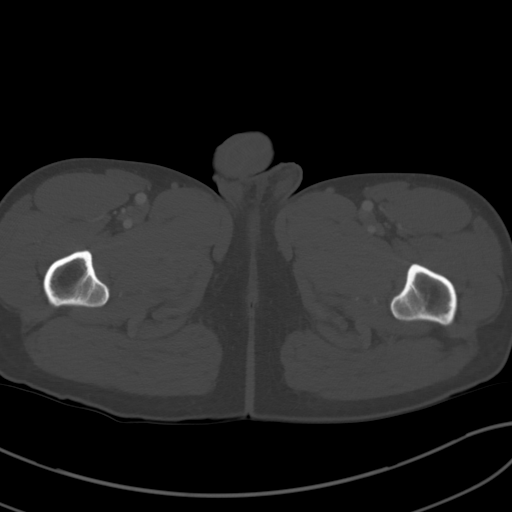
[im 16/93  soft-tissue]
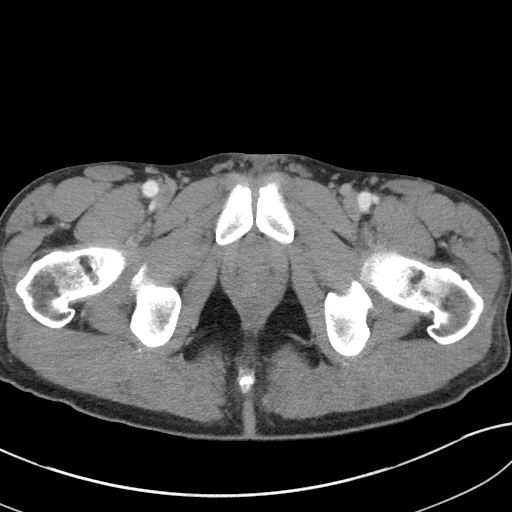
[im 21/93  soft-tissue]
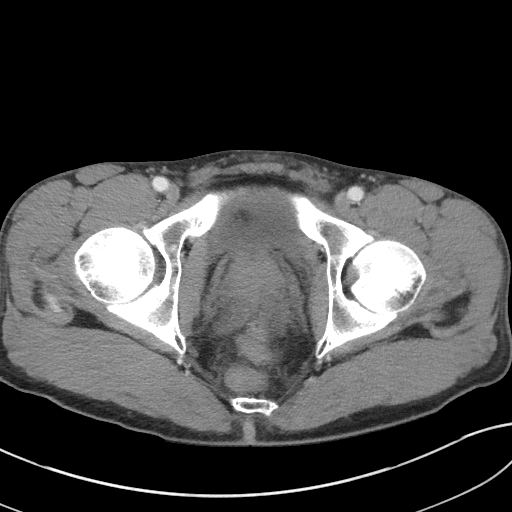
[im 26/93  soft-tissue]
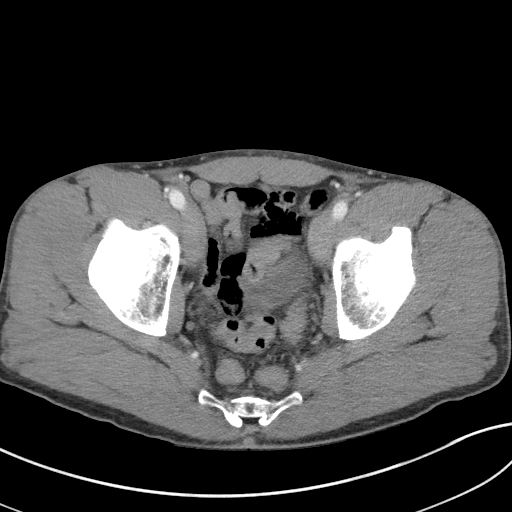
[im 36/93  soft-tissue]
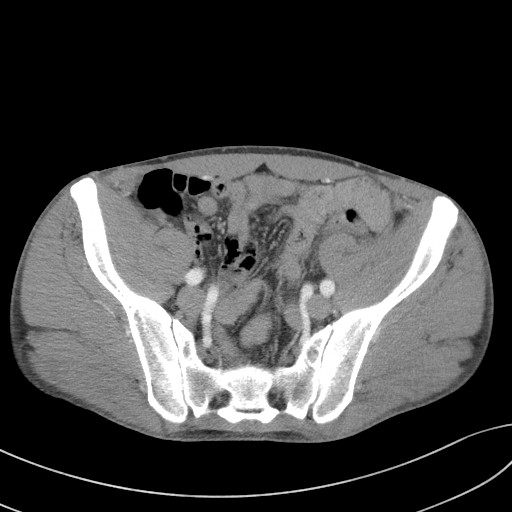
[im 41/93  soft-tissue]
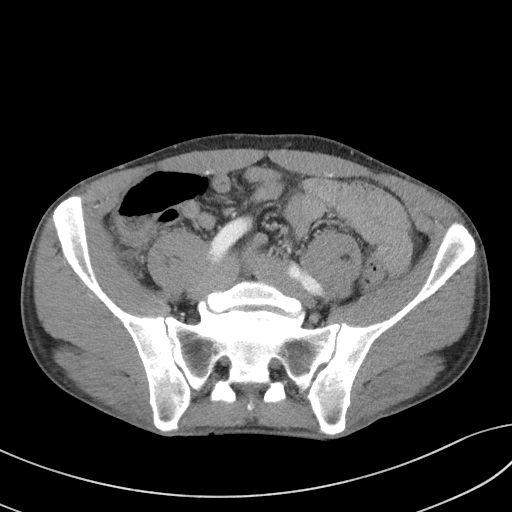
[im 52/93  soft-tissue]
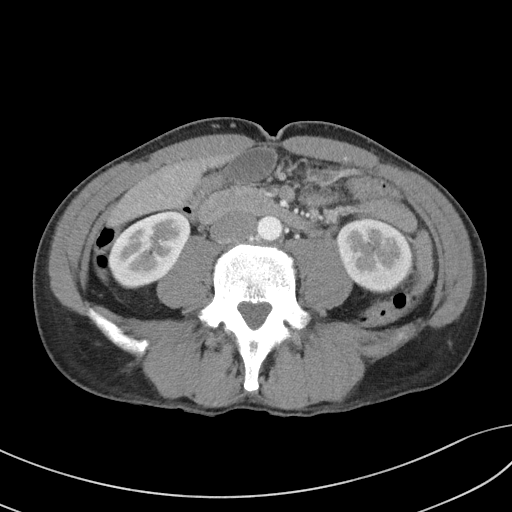
[im 57/93  soft-tissue]
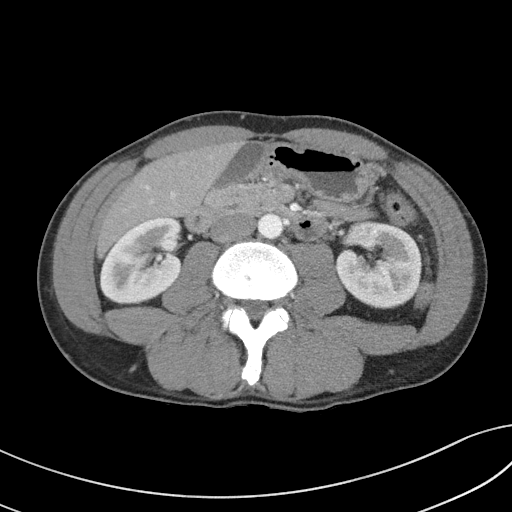
[im 67/93  soft-tissue]
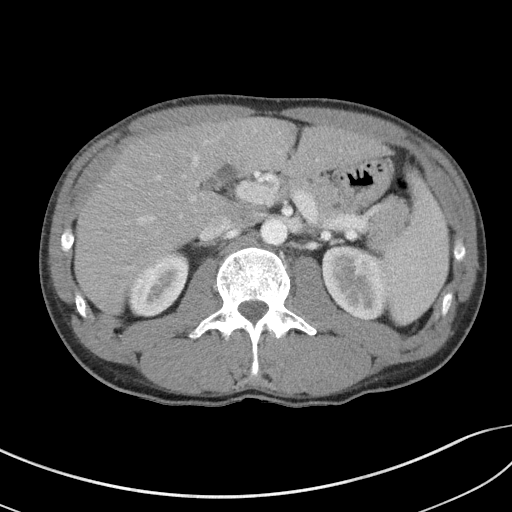
[im 67/93  bone]
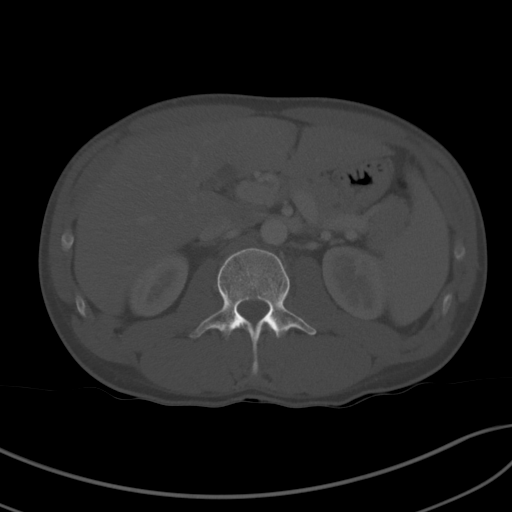
[im 72/93  soft-tissue]
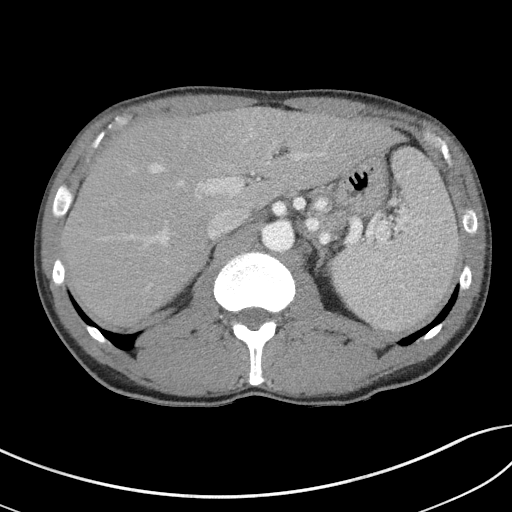
[im 77/93  soft-tissue]
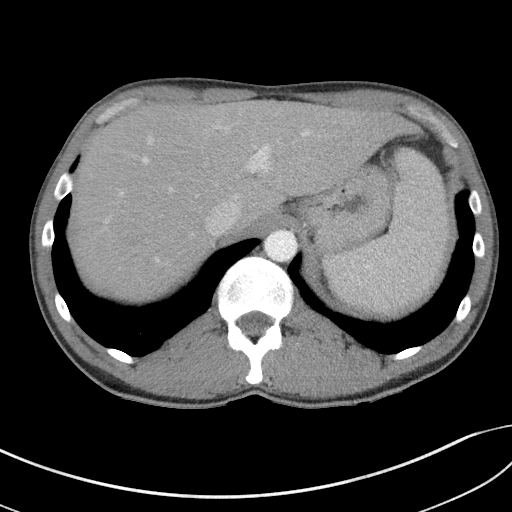
[im 87/93  soft-tissue]
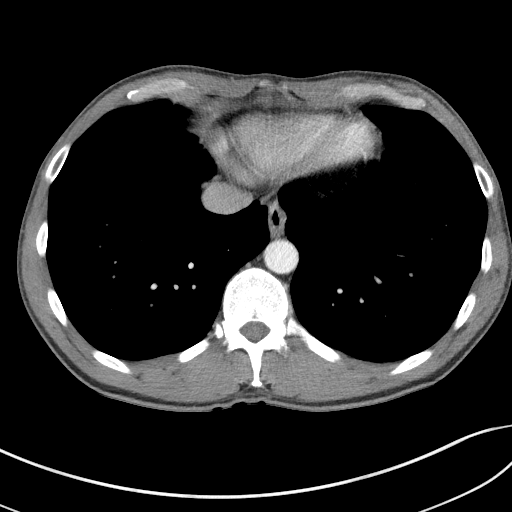

[Series 5: coronal st · coronal · 0.62mm/px · 3 of 82 slices shown]
[im 28/82  soft-tissue]
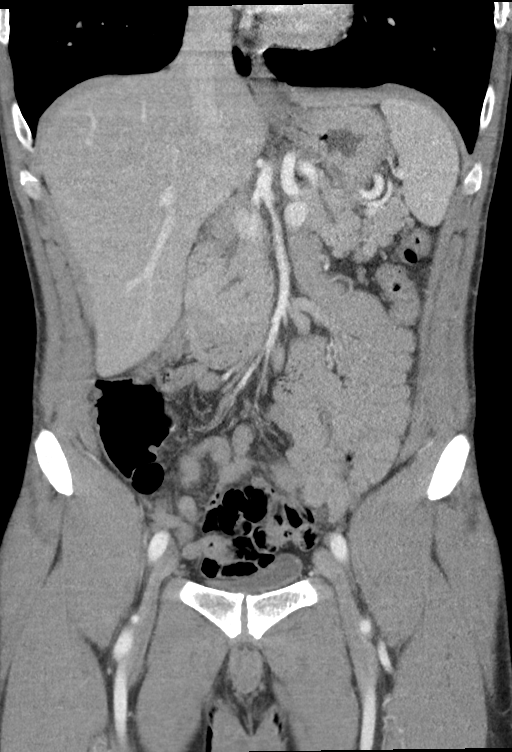
[im 37/82  soft-tissue]
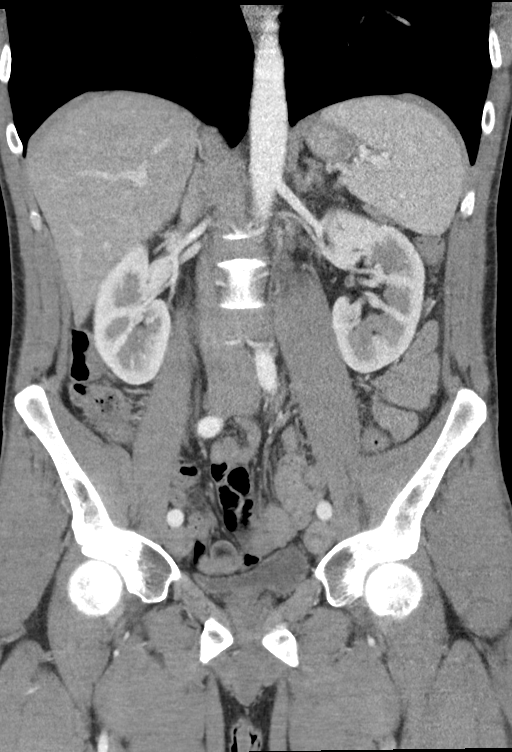
[im 46/82  soft-tissue]
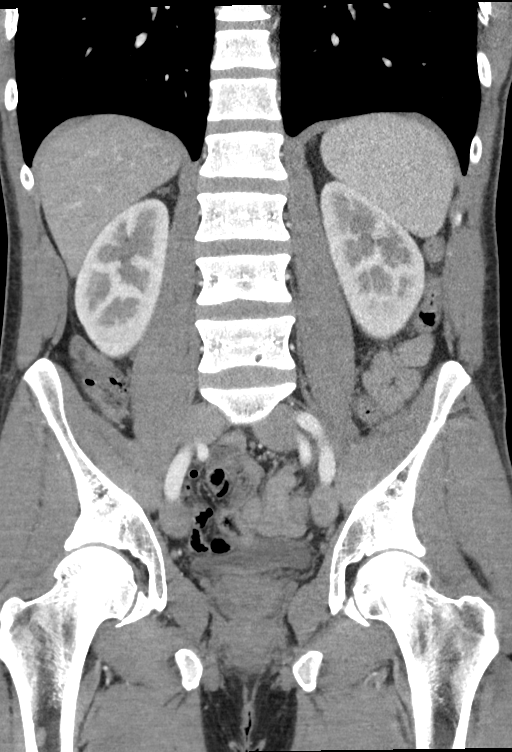

[15 of 46 positions shown; findings below may reference images not displayed]

FINDINGS: Lower chest: Lung bases demonstrate no consolidation or effusion.
Bandlike density in the posterior left lower lobe likely
atelectasis. Heart size within normal limits.

Hepatobiliary: Subcentimeter hypodensities in the liver too small to
further characterize. No calcified gallstone or biliary dilatation.

Pancreas: Unremarkable. No pancreatic ductal dilatation or
surrounding inflammatory changes.

Spleen: Borderline enlarged

Adrenals/Urinary Tract: Adrenal glands are normal. Punctate stone
lower pole right kidney. No hydronephrosis. The bladder is normal

Stomach/Bowel: Stomach is nonenlarged. No dilated small bowel.
Negative appendix. Possible mild wall thickening at the splenic
flexure and proximal descending colon.

Vascular/Lymphatic: No significant vascular findings are present. No
enlarged abdominal or pelvic lymph nodes.

Reproductive: Prostate is unremarkable.

Other: No abdominal wall hernia or abnormality. No abdominopelvic
ascites.

Musculoskeletal: Chronic bilateral pars defect at L5 with grade 1
anterolisthesis of L5 on S1.
IMPRESSION: 1. Negative for hydronephrosis. Punctate nonobstructing stone lower
pole right kidney.
2. Collapsed appearance versus mild colitis type changes of the
splenic flexure and proximal descending colon
3. Borderline to mild enlargement of the spleen
4. Chronic bilateral pars defect at L5 with 10 mm anterolisthesis L5
on S1 and associated degenerative change.

## 2021-07-26 ENCOUNTER — Encounter (HOSPITAL_COMMUNITY): Payer: Self-pay

## 2021-07-26 ENCOUNTER — Emergency Department (HOSPITAL_COMMUNITY)
Admission: EM | Admit: 2021-07-26 | Discharge: 2021-07-27 | Disposition: A | Discharge status: Home / Self Care | Payer: Medicaid Other | Attending: Emergency Medicine | Admitting: Emergency Medicine

## 2021-07-26 DIAGNOSIS — Z046 Encounter for general psychiatric examination, requested by authority: Secondary | ICD-10-CM | POA: Diagnosis present

## 2021-07-26 DIAGNOSIS — Z79899 Other long term (current) drug therapy: Secondary | ICD-10-CM | POA: Insufficient documentation

## 2021-07-26 DIAGNOSIS — F1290 Cannabis use, unspecified, uncomplicated: Secondary | ICD-10-CM | POA: Diagnosis not present

## 2021-07-26 DIAGNOSIS — Z20822 Contact with and (suspected) exposure to covid-19: Secondary | ICD-10-CM | POA: Diagnosis not present

## 2021-07-26 DIAGNOSIS — F17210 Nicotine dependence, cigarettes, uncomplicated: Secondary | ICD-10-CM | POA: Diagnosis not present

## 2021-07-26 DIAGNOSIS — F29 Unspecified psychosis not due to a substance or known physiological condition: Secondary | ICD-10-CM

## 2021-07-26 LAB — COMPREHENSIVE METABOLIC PANEL
ALT: 24 U/L (ref 0–44)
AST: 24 U/L (ref 15–41)
Albumin: 3.6 g/dL (ref 3.5–5.0)
Alkaline Phosphatase: 85 U/L (ref 38–126)
Anion gap: 8 (ref 5–15)
BUN: 11 mg/dL (ref 6–20)
CO2: 25 mmol/L (ref 22–32)
Calcium: 8.8 mg/dL — ABNORMAL LOW (ref 8.9–10.3)
Chloride: 105 mmol/L (ref 98–111)
Creatinine, Ser: 0.92 mg/dL (ref 0.61–1.24)
GFR, Estimated: >60 mL/min (ref >60)
Glucose, Bld: 125 mg/dL — ABNORMAL HIGH (ref 70–99)
Potassium: 3.9 mmol/L (ref 3.5–5.1)
Sodium: 138 mmol/L (ref 135–145)
Total Bilirubin: 0.3 mg/dL (ref 0.3–1.2)
Total Protein: 6.1 g/dL — ABNORMAL LOW (ref 6.5–8.1)

## 2021-07-26 LAB — URINALYSIS, ROUTINE W REFLEX MICROSCOPIC
Bilirubin Urine: NEGATIVE
Glucose, UA: NEGATIVE mg/dL
Hgb urine dipstick: NEGATIVE
Ketones, ur: NEGATIVE mg/dL
Nitrite: NEGATIVE
Protein, ur: NEGATIVE mg/dL
Specific Gravity, Urine: 1.025 (ref 1.005–1.030)
pH: 6.5 (ref 5.0–8.0)

## 2021-07-26 LAB — CBC WITH DIFFERENTIAL/PLATELET
Abs Immature Granulocytes: 0.04 10*3/uL (ref 0.00–0.07)
Basophils Absolute: 0.1 10*3/uL (ref 0.0–0.1)
Basophils Relative: 1 %
Eosinophils Absolute: 0.4 10*3/uL (ref 0.0–0.5)
Eosinophils Relative: 3 %
HCT: 45.1 % (ref 39.0–52.0)
Hemoglobin: 14.4 g/dL (ref 13.0–17.0)
Immature Granulocytes: 0 %
Lymphocytes Relative: 27 %
Lymphs Abs: 3.3 10*3/uL (ref 0.7–4.0)
MCH: 28.6 pg (ref 26.0–34.0)
MCHC: 31.9 g/dL (ref 30.0–36.0)
MCV: 89.5 fL (ref 80.0–100.0)
Monocytes Absolute: 0.9 10*3/uL (ref 0.1–1.0)
Monocytes Relative: 7 %
Neutro Abs: 7.7 10*3/uL (ref 1.7–7.7)
Neutrophils Relative %: 62 %
Platelets: 362 10*3/uL (ref 150–400)
RBC: 5.04 MIL/uL (ref 4.22–5.81)
RDW: 14.3 % (ref 11.5–15.5)
WBC: 12.4 10*3/uL — ABNORMAL HIGH (ref 4.0–10.5)
nRBC: 0 % (ref 0.0–0.2)

## 2021-07-26 LAB — RAPID URINE DRUG SCREEN, HOSP PERFORMED
Amphetamines: NOT DETECTED
Amphetamines: NOT DETECTED
Barbiturates: NOT DETECTED
Barbiturates: NOT DETECTED
Benzodiazepines: NOT DETECTED
Benzodiazepines: NOT DETECTED
Cocaine: NOT DETECTED
Cocaine: NOT DETECTED
Opiates: NOT DETECTED
Opiates: NOT DETECTED
Tetrahydrocannabinol: POSITIVE — AB
Tetrahydrocannabinol: POSITIVE — AB

## 2021-07-26 LAB — URINALYSIS, MICROSCOPIC (REFLEX)
Squamous Epithelial / HPF: NONE SEEN (ref 0–5)
WBC, UA: >50 WBC/hpf (ref 0–5)

## 2021-07-26 LAB — RESP PANEL BY RT-PCR (FLU A&B, COVID) ARPGX2
Influenza A by PCR: NEGATIVE
Influenza B by PCR: NEGATIVE
SARS Coronavirus 2 by RT PCR: NEGATIVE

## 2021-07-26 LAB — ETHANOL: Alcohol, Ethyl (B): <10 mg/dL (ref <10)

## 2021-07-26 NOTE — ED Triage Notes (Signed)
Pt BIB [employer] for eval of IVC. Per paperwork, pt is hallucinating and have active conversations w/ someone who is not actually present. Pt continues to believe wife is dead and has committed suicide, but wife is very much alive. Per IVC paperwork pt also reports there is a "purge" coming

## 2021-07-26 NOTE — ED Notes (Addendum)
Pt approached RN asking why he is here. Pt has normal rate of speech/able to hold normal conversation, does not remember prior behavior described in admission notes. Expresses frustration about being IVC'd. Pt provided updates on [facility] recommendations post TTS. Pt requesting to speech with psychiatrist again. Rn will follow up with [facility] on this request.

## 2021-07-26 NOTE — Progress Notes (Signed)
Patient has been denied by [facility] due to no appropriate beds available. Patient meets BH inpatient criteria per Liborio Nixon, NP. Patient has been faxed out to the following facilities:   [facility]  [address], [place] [address] [phone]  [facility]  [address] [place] [address] [phone]  [facility]  [address], [place] [address] [phone]  [facility]  [address] [place] [address] [phone]  [facility]  [address] [place], [place] [address] [phone]  [facility]  [address] [place], [place] [address] [phone]  [facility]  [address] [place] [address] [phone]  [facility]  [address] [place] [address] [phone]  [facility]  [address], [place] [address] [phone]  [facility]  [address], [place] [address] [phone]   Damita Dunnings, MSW, LCSW-A  10:57 AM 07/26/2021

## 2021-07-26 NOTE — ED Notes (Signed)
Pt up to bathroom. Got breakfast. Denies needs. Pt to go for inpatient.

## 2021-07-26 NOTE — BH Assessment (Signed)
Comprehensive Clinical Assessment (CCA) Note  07/26/2021 Basilia Jumbo [id number]  Discharge Disposition: Lindon Romp, NP, reviewed pt's chart and information and determined pt should receive continuous assessment and be re-assessed by psychiatry later today. This information was relayed to pt's team at 0531.  The patient demonstrates the following risk factors for suicide: Chronic risk factors for suicide include: psychiatric disorder of Cannabis-induced psychotic disorder, With mild use disorder . Acute risk factors for suicide include: family or marital conflict and loss (financial, interpersonal, professional). Protective factors for this patient include: positive social support, responsibility to others (children, family), and hope for the future. Considering these factors, the overall suicide risk at this point appears to be none. Patient is not appropriate for outpatient follow up.  Therefore, no sitter is recommended for suicide precautions.  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 The IVC states:  "Respondent is talking to someone beside him that is not there. Respondent has a full conversation while sitting by himself. According to his wife, respondent is not in his right frame of mind. Respondent is convinced that his wife has committed suicide but she is alive. Respondent told his wife that a "purge" is coming soon. Respondent was committed 10 years ago but the diagnosis is unknown."  When asked why he was brought to the ED, he states, "I have no idea - they just told me (in)voluntary papers were filed. The other day a friend saw my wife's car and there were police on the scene and they said my wife killed herself."  Pt went on to explain that he walked to the hospital and spent many hours attempting to determine if his wife was admitted or was in the morgue. Pt states he eventually called and spoke with his wife and realized she was not dead.   Pt denies current SI or a hx of SI. He denies he's ever attempted to kill himself or that he has a plan to kill himself. Pt acknowledges he has been hospitalized in the past when he was expressing thoughts that "Jesus was coming back soon," though he states he never said when Jesus was coming back or that he knew what was going to happen. Pt denies HI, AVH, NSSIB, and access to guns/weapons. Pt shares he has court on January 6 for an incident in which his infant daughter's arm broke, though he states he believes she has something genetically wrong with his shoulder, like his son and his wife have, which would have made her arm break easily when he picked her up; [place] online state pt has been charged with [employer] Negligent Child Abuse - Serious Physical Injury. Pt states he uses no substances with the exception of CBD; his UDA was positive for THC.  Pt is oriented x5. His recent/remote memory is intact. Pt was cooperative throughout the assessment process. Pt's insight, judgement, and impulse control is fair at this time.  Patient Reported Information How did you hear about [place]? Legal System  What Is the Reason for Your Visit/Call Today? Pt was IVCed and brought to [facility] by [facility]. When asked why he was brought to the ED, he states, "I have no idea - they just told me (in)voluntary papers were filed. The other  day a friend saw my wife's car and there were police on the scene and they said my wife killed herself." Pt went on to explain that he walked to the hospital and spent many hours attempting to determine if his wife was admitted or was in the morgue. Pt states he eventually called and spoke with his wife and realized she was not dead. Pt denies current SI or a hx of SI. He  denies he's ever attempted to kill himself or that he has a plan to kill himself. Pt acknowledges he has been hospitalized in the past when he was expressing thoughts that "Jesus was coming back soon," though he states he never said when Jesus was coming back or that he knew what was going to happen. Pt denies HI, AVH, NSSIB, and access to guns/weapons. Pt shares he has court on January 6 for felony child misconduct. Pt states he uses no substances with the exception of CBD.  How Long Has This Been Causing You Problems? <Week  What Do You Feel Would Help You the Most Today? -- (Pt does not believe he needs assistance at this time)   Have You Recently Had Any Thoughts About Hurting Yourself? No  Are You Planning to Commit Suicide/Harm Yourself At This time? No   Have you Recently Had Thoughts About [place]? No  Are You Planning to Harm Someone at This Time? No  Explanation: No data recorded  Have You Used Any Alcohol or Drugs in the Past 24 Hours? Yes  How Long Ago Did You Use Drugs or Alcohol? No data recorded What Did You Use and How Much? CBD   Do You Currently Have a Therapist/Psychiatrist? No  Name of Therapist/Psychiatrist: No data recorded  Have You Been Recently Discharged From Any Office Practice or Programs? No  Explanation of Discharge From Practice/Program: No data recorded    CCA Screening Triage Referral Assessment Type of Contact: Tele-Assessment  Telemedicine Service Delivery: Telemedicine service delivery: This service was provided via telemedicine using a 2-way, interactive audio and video technology  Is this Initial or Reassessment? Initial Assessment  Date Telepsych consult ordered in [facility]:  07/26/21  Time Telepsych consult ordered in [facility]:  0129  Location of Assessment: [facility] ED  Provider Location: [facility] Assessment Services   Collateral Involvement: IVC ppwk   Does Patient Have a [place]? No data recorded Name  and Contact of Legal Guardian: No data recorded If Minor and Not Living with Parent(s), Who has Custody? N/A  Is CPS involved or ever been involved? In the Past  Is APS involved or ever been involved? Never   Patient Determined To Be At Risk for Harm To Self or Others Based on Review of Patient Reported Information or Presenting Complaint? No  Method: No data recorded Availability of Means: No data recorded Intent: No data recorded Notification Required: No data recorded Additional Information for Danger to Others Potential: No data recorded Additional Comments for Danger to Others Potential: No data recorded Are There Guns or Other Weapons in Your Home? No data recorded Types of Guns/Weapons: No data recorded Are These Weapons Safely Secured?                            No data recorded Who Could Verify You Are Able To Have These Secured: No data recorded Do You Have any Outstanding Charges, Pending Court Dates, Parole/Probation? No data recorded Contacted To Inform  of Risk of Harm To Self or Others: -- (N/A)    Does Patient Present under Involuntary Commitment? Yes  IVC Papers Initial File Date: 07/25/21   [place] of Residence: [place]   Patient Currently Receiving the Following Services: Not Receiving Services   Determination of Need: Urgent (48 hours)   Options For Referral: Medication Management; Outpatient Therapy; Other: Comment (Overnight observation)     CCA Biopsychosocial Patient Reported Schizophrenia/Schizoaffective Diagnosis in Past: No   Strengths: Pt cares about his children. He maintains employment. Pt is able to identify his thoughts, feelings, and concerns. He answers the questions posed.   Mental Health Symptoms Depression:   Difficulty Concentrating; Sleep (too much or little)   Duration of Depressive symptoms:  Duration of Depressive Symptoms: Greater than two weeks   Mania:   Racing thoughts   Anxiety:    Worrying; Tension    Psychosis:   Delusions; Hallucinations   Duration of Psychotic symptoms:  Duration of Psychotic Symptoms: Less than six months   Trauma:   Guilt/shame   Obsessions:   None   Compulsions:   None   Inattention:   None   Hyperactivity/Impulsivity:   None   Oppositional/Defiant Behaviors:   None   Emotional Irregularity:   Mood lability; Intense/unstable relationships   Other Mood/Personality Symptoms:   None noted    Mental Status Exam Appearance and self-care  Stature:   Average   Weight:   Average weight   Clothing:   -- (Pt is dressed in scrubs)   Grooming:   Normal   Cosmetic use:   None   Posture/gait:   Normal   Motor activity:   Not Remarkable   Sensorium  Attention:   Normal   Concentration:   Normal   Orientation:   X5   Recall/memory:   Normal   Affect and Mood  Affect:   Appropriate   Mood:   Other (Comment) (Pleasant)   Relating  Eye contact:   Normal   Facial expression:   Responsive   Attitude toward examiner:   Cooperative   Thought and Language  Speech flow:  Clear and Coherent   Thought content:   Appropriate to Mood and Circumstances   Preoccupation:   None   Hallucinations:   -- (Pt denies; IVC ppwk states pt has been talking to people who are not there)   Organization:  No data recorded  [employer] of Knowledge:   Average   Intelligence:   Average   Abstraction:   -- (Pt presents normal; IVC ppwk states pt has been talking to people who are not there)   Judgement:   -- (Pt presents with good judgement ; IVC ppwk states pt thought his wife was dead)   Reality Testing:   -- (Pt presents with good judgement ; IVC ppwk states pt thought his wife was dead)   Insight:   -- (Pt presents with fair insight; IVC ppwk states pt thought his wife was dead)   Decision Making:   Impulsive   Social Functioning  Social Maturity:   Impulsive   Social Judgement:   Naive    Stress  Stressors:   Family conflict; Grief/losses; Housing; Scientist, research (physical sciences); Relationship; Transitions   Coping Ability:   Overwhelmed; Deficient supports   Skill Deficits:   Decision making; Self-control   Supports:   Family     Religion: Religion/Spirituality Are You A Religious Person?: Yes What is Your Religious Affiliation?: Pentecostal How Might This Affect Treatment?: Not  assessed  Leisure/Recreation: Leisure / Recreation Do You Have Hobbies?:  (Not assessed)  Exercise/Diet: Exercise/Diet Do You Exercise?:  (Not assessed) Have You Gained or Lost A Significant Amount of Weight in the Past Six Months?: No Do You Follow a Special Diet?: No Do You Have Any Trouble Sleeping?: Yes Explanation of Sleeping Difficulties: Pt states he typically sleeps 5-8 hours; he states he's lately been sleeping 3-5 hours   CCA Employment/Education Employment/Work Situation: Employment / Work Situation Employment Situation: Employed Work Stressors: None noted Patient's Job has Been Impacted by Current Illness: No Has Patient ever Been in Equities traderthe Military?: No  Education: Education Is Patient Currently Attending School?: No Last Grade Completed: 12 Did You Product managerAttend College?: No Did You Have An Individualized Education Program (IIEP): No Did You Have Any Difficulty At Progress EnergySchool?: No Patient's Education Has Been Impacted by Current Illness: No   CCA Family/Childhood History Family and Relationship History: Family history Marital status: Separated Separated, when?: Not assessed What types of issues is patient dealing with in the relationship?: Not assessed Additional relationship information: Not assessed Does patient have children?: Yes How many children?: 4 How is patient's relationship with their children?: Pt states he has a good relationship with his children.  Childhood History:  Childhood History By whom was/is the patient raised?: Both parents Did patient suffer any  verbal/emotional/physical/sexual abuse as a child?: No Did patient suffer from severe childhood neglect?: No Has patient ever been sexually abused/assaulted/raped as an adolescent or adult?: No Was the patient ever a victim of a crime or a disaster?: No Witnessed domestic violence?: No Has patient been affected by domestic violence as an adult?: No  Child/Adolescent Assessment:     CCA Substance Use Alcohol/Drug Use: Alcohol / Drug Use Pain Medications: See MAR Prescriptions: See MAR Over the Counter: See MAR History of alcohol / drug use?: Yes Longest period of sobriety (when/how long): Unknown Negative Consequences of Use:  (Pt denies) Withdrawal Symptoms:  (Pt denies) Substance #1 Name of Substance 1: CBD 1 - Age of First Use: Unknown 1 - Amount (size/oz): Unknown 1 - Frequency: Unknown 1 - Duration: Unknown 1 - Last Use / Amount: Today 1 - Method of Aquiring: Purchase 1- Route of Use: Smoke                       ASAM's:  Six Dimensions of Multidimensional Assessment  Dimension 1:  Acute Intoxication and/or Withdrawal Potential:      Dimension 2:  Biomedical Conditions and Complications:      Dimension 3:  Emotional, Behavioral, or Cognitive Conditions and Complications:     Dimension 4:  Readiness to Change:     Dimension 5:  Relapse, Continued use, or Continued Problem Potential:     Dimension 6:  Recovery/Living Environment:     ASAM Severity Score:    ASAM Recommended Level of Treatment: ASAM Recommended Level of Treatment:  (N/A)   Substance use Disorder (SUD) Substance Use Disorder (SUD)  Checklist Symptoms of Substance Use:  (N/A)  Recommendations for Services/Supports/Treatments: Recommendations for Services/Supports/Treatments Recommendations For Services/Supports/Treatments: Medication Management, Individual Therapy, Other (Comment) (Continuous Assessment)  Discharge Disposition: Nira ConnJason Berry, NP, reviewed pt's chart and information and  determined pt should receive continuous assessment and be re-assessed by psychiatry later today. This information was relayed to pt's team at 0531.  DSM5 Diagnoses: There are no problems to display for this patient.    Referrals to Alternative Service(s): Referred to Alternative Service(s):   Place:  Date:   Time:    Referred to Alternative Service(s):   Place:   Date:   Time:    Referred to Alternative Service(s):   Place:   Date:   Time:    Referred to Alternative Service(s):   Place:   Date:   Time:     Ralph Dowdy, LMFT

## 2021-07-26 NOTE — Progress Notes (Addendum)
Per Dr. Lucianne Muss, Disposition update: Patient meets criteria for inpatient psychiatric treatment.   Secure chat sent to Malachi Bonds, [place]., with the stated disposition.

## 2021-07-26 NOTE — ED Notes (Signed)
Pt at nurses station making first phone call of the day. Sating "he doesn't need to be here and he has a clear head" to who he was talking to on the phone. Pt hung up the phone after his call, he walked back over to his stretcher, pt is beginning to seem quite agitated.

## 2021-07-26 NOTE — ED Provider Notes (Signed)
[facility] EMERGENCY DEPARTMENT Provider Note   CSN: [id number] Arrival date & time: 07/26/21  0033     History Chief Complaint  Patient presents with   IVC    Randy Pratt is a 40 y.o. male.  Pt brought in by [employer] under IVC taken out by wife who reported "pt is talking to someone beside him but is not clear.  Respondent has a full conversation while sitting by himself.  According to his wife, respondent is not in his right frame of mind.  Respondent is convinced that his wife has committed suicide but she is alive.  Respondent told his wife that a "purge" this coming scene.  Respondent was committed 10 years ago but the diagnosis is unknown."   Patient states that he and his wife are in the process of getting a divorce.  States that a few days ago he was on the sheets on [employer] when he was told that his wife had committed suicide and slit her throat.  Patient walked to the hospital, spent several hours trying to figure out if his wife was at this hospital for in the morgue.  Patient was fairly told that his wife was in the morning but that he could not come in and see her because of COVID.  Patient was eventually told he needed to leave the premises so he left.  Patient states that he was living with his niece however that did not work out so he is currently living in someone's house who is out of town.  He denies any complaints or concerns, feels this is a misunderstanding. Denies suicidal homicidal ideation, auditory visual examination, reports marijuana use, denies other drug or alcohol use.      History reviewed. No pertinent past medical history.  There are no problems to display for this patient.   Past Surgical History:  Procedure Laterality Date   HAND SURGERY  2012   L wrist, cut 8 tendons, 1 nerve and main artery       Family History  Problem Relation Age of Onset   Healthy Mother    Healthy Father     Social History   Tobacco Use    Smoking status: Every Day    Packs/day: 0.50    Types: Cigarettes   Smokeless tobacco: Never  Substance Use Topics   Alcohol use: No   Drug use: No    Home Medications Prior to Admission medications   Medication Sig Start Date End Date Taking? Authorizing Provider  acetaminophen (TYLENOL) 325 MG tablet Take 325 mg by mouth every 6 (six) hours as needed for moderate pain or headache.   Yes [provider]  multivitamin (ONE-A-DAY MEN'S) TABS tablet Take 1 tablet by mouth daily.   Yes [provider]  HYDROcodone-acetaminophen (NORCO/VICODIN) 5-325 MG tablet Take 1 tablet by mouth every 6 (six) hours as needed for moderate pain or severe pain. Patient not taking: Reported on 07/26/2021 05/05/20   Linus Mako B, NP  ibuprofen (ADVIL) 800 MG tablet Take 1 tablet (800 mg total) by mouth every 8 (eight) hours as needed. Patient not taking: Reported on 07/26/2021 05/05/20   Georgetta Haber, NP  cetirizine (ZYRTEC ALLERGY) 10 MG tablet Take 1 tablet (10 mg total) by mouth daily. 11/08/19 11/12/19  Avegno, Zachery Dakins, FNP  fluticasone (FLONASE) 50 MCG/ACT nasal spray Place 1 spray into both nostrils daily for 14 days. 11/08/19 11/12/19  Durward Parcel, FNP    Allergies  Patient has no known allergies.  Review of Systems   Review of Systems  Constitutional:  Negative for fever.  Respiratory:  Negative for shortness of breath.   Cardiovascular:  Negative for chest pain.  Gastrointestinal:  Negative for abdominal pain, nausea and vomiting.  Musculoskeletal:  Negative for arthralgias and myalgias.  Skin:  Negative for rash and wound.  Allergic/Immunologic: Negative for immunocompromised state.  Neurological:  Negative for weakness.  Hematological:  Negative for adenopathy.  Psychiatric/Behavioral:  Negative for behavioral problems, hallucinations, sleep disturbance and suicidal ideas. The patient is not nervous/anxious.   All other systems reviewed and are  negative.  Physical Exam Updated Vital Signs BP 123/85 (BP Location: Left Arm)   Pulse 76   Temp 98.2 F (36.8 C)   Resp 16   Ht 6\' 1"  (1.854 m)   Wt 80 kg   SpO2 98%   BMI 23.27 kg/m   Physical Exam Vitals and nursing note reviewed.  Constitutional:      General: He is not in acute distress.    Appearance: He is well-developed. He is not diaphoretic.  HENT:     Head: Normocephalic and atraumatic.  Cardiovascular:     Rate and Rhythm: Normal rate and regular rhythm.     Pulses: Normal pulses.     Heart sounds: Normal heart sounds.  Pulmonary:     Effort: Pulmonary effort is normal.  Abdominal:     Palpations: Abdomen is soft.     Tenderness: There is no abdominal tenderness.  Skin:    General: Skin is warm and dry.     Findings: No erythema.  Neurological:     Mental Status: He is alert and oriented to person, place, and time.  Psychiatric:        Mood and Affect: Mood normal.        Behavior: Behavior normal.    ED Results / Procedures / Treatments   Labs (all labs ordered are listed, but only abnormal results are displayed) Labs Reviewed  COMPREHENSIVE METABOLIC PANEL - Abnormal; Notable for the following components:      Result Value   Glucose, Bld 125 (*)    Calcium 8.8 (*)    Total Protein 6.1 (*)    All other components within normal limits  RAPID URINE DRUG SCREEN, HOSP PERFORMED - Abnormal; Notable for the following components:   Tetrahydrocannabinol POSITIVE (*)    All other components within normal limits  CBC WITH DIFFERENTIAL/PLATELET - Abnormal; Notable for the following components:   WBC 12.4 (*)    All other components within normal limits  RESP PANEL BY RT-PCR (FLU A&B, COVID) ARPGX2  ETHANOL    EKG EKG Interpretation  Date/Time:  Thursday July 26 2021 05:05:09 EST Ventricular Rate:  63 PR Interval:  186 QRS Duration: 90 QT Interval:  382 QTC Calculation: 390 R Axis:   93 Text Interpretation: Normal sinus rhythm Rightward axis  Borderline ECG No old tracing to compare Confirmed by 12-02-2004 (Dione Booze) on 07/26/2021 5:27:30 AM  Radiology No results found.  Procedures Procedures   Medications Ordered in ED Medications - No data to display  ED Course  I have reviewed the triage vital signs and the nursing notes.  Pertinent labs & imaging results that were available during my care of the patient were reviewed by me and considered in my medical decision making (see chart for details).  Clinical Course as of 07/26/21 0535  Thu Jul 26, 2021  333 40 year old male brought  in by police under IVC order as above.  No other medical complaints.  Exam is unremarkable.  Patient with unremarkable lab work-up, UDS positive for marijuana, CBC, CMP, alcohol/COVID/flu without significant findings. Patient is medically cleared for behavioral health evaluation and disposition. [LM]  [phone] Patient is evaluated by behavioral health who would like to continue with further evaluations and reassessment in the morning. [LM]    Clinical Course User Index [LM] Alden Hipp   MDM Rules/Calculators/A&P                           Final Clinical Impression(s) / ED Diagnoses Final diagnoses:  None    Rx / DC Orders ED Discharge Orders     None        Jeannie Fend, PA-C 07/26/21 0535    Sabas Sous, MD 07/26/21 [phone]

## 2021-07-26 NOTE — ED Notes (Signed)
Pt has intermittently been displaying bizarre behavior since waking, this RN witnessed pt up in hall apparently dancing his way from bathroom back to stretcher. No aggressive or violent behavior noted at this time but monitoring closely.

## 2021-07-26 NOTE — ED Notes (Signed)
[phone] Herbert Seta pt wife would like to speak to the nurse.

## 2021-07-26 NOTE — Progress Notes (Addendum)
CSW attempted to follow-up with [facility] [phone] and was unsuccessful. CSW left a voicemail, requesting a phone call back. CSW will assist and follow with placement needs.   CSW sent to the following facilities to be reviewed for inpatient behavioral health placement:  [facility]  [address], [place] [address] [phone]  [facility]  [address] [place] [address] [phone]    Maryjean Ka, MSW, [facility] 07/26/2021 11:21 PM

## 2021-07-26 NOTE — ED Notes (Signed)
Belongings inventoried and locked up in locker #7 in purple zone. 2 personal belonging bag 1 pt valuable bag locked up with security. Pt in purple scrubs and wanded

## 2021-07-26 NOTE — ED Provider Notes (Signed)
Emergency Medicine Provider Triage Evaluation Note  Randy Pratt , a 40 y.o. male  was evaluated in triage.  Pt brought in by [employer] under IVC taken out by wife who reported "pt is talking to someone beside him but is not clear.  Respondent has a full conversation while sitting by himself.  According to his wife, respondent is not in his right frame of mind.  Respondent is convinced that his wife has committed suicide but she is alive.  Respondent told his wife that a "purge" this coming scene.  Respondent was committed 10 years ago but the diagnosis is unknown."  Patient states that he and his wife are in the process of getting a divorce.  States that a few days ago he was on the sheets on [employer] when he was told that his wife had committed suicide and slit her throat.  Patient walked to the hospital, spent several hours trying to figure out if his wife was at this hospital for in the morgue.  Patient was fairly told that his wife was in the morning but that he could not come in and see her because of COVID.  Patient was eventually told he needed to leave the premises so he left.  Patient states that he was living with his niece however that did not work out so he is currently living in someone's house who is out of town.  He denies any complaints or concerns, feels this is a misunderstanding. Denies suicidal homicidal ideation, auditory visual examination, reports marijuana use, denies other drug or alcohol use.  Review of Systems  Positive: No complaints  Negative: CP, abdominal pain, auditory or visual [place]  Physical Exam  BP (!) 149/95   Pulse (!) 102   Temp 98.1 F (36.7 C) (Oral)   Resp 18   SpO2 100%  Gen:   Awake, no distress   Resp:  Normal effort  MSK:   Moves extremities without difficulty  Other:    Medical Decision Making  Medically screening exam initiated at 12:35 AM.  Appropriate orders placed.  [name] was informed that the remainder of the evaluation  will be completed by another provider, this initial triage assessment does not replace that evaluation, and the importance of remaining in the ED until their evaluation is complete.     Jeannie Fend, PA-C 07/26/21 0128    Sabas Sous, MD 07/26/21 [phone]

## 2021-07-26 NOTE — ED Notes (Signed)
TTS at this time. 

## 2021-07-27 MED ORDER — ACETAMINOPHEN 325 MG PO TABS
650.0000 mg | ORAL_TABLET | Freq: Four times a day (QID) | ORAL | Status: DC | PRN
  Administered 2021-07-27: 650 mg via ORAL
  Filled 2021-07-27: qty 2

## 2021-07-27 NOTE — ED Notes (Signed)
Officer Fenton Malling here to transport pt to [facility] at this time

## 2021-07-27 NOTE — ED Notes (Signed)
Called report to Tamala Fothergill, Charity fundraiser at [place]. Sheriffs office called

## 2021-07-27 NOTE — Progress Notes (Signed)
Patient has been faxed out after being denied by [facility] due to no appropriate beds being available. Patient meets BH inpatient criteria per Liborio Nixon, NP. Patient has been faxed out to the following facilities:    [facility]  [address] [place] [address] [phone]  [facility]  [address] [place] [address] [phone]  [facility]  [address] [place] [address] [phone]  [facility]  [address] [place] [address] [phone]  [facility]  [address] [place], [place] [address] [phone]  [facility]  [address] [place], [place] [address] [phone]  [facility]  [address] [place] [address] [phone]  [facility]  [address] [place] [address] [phone]  [facility]  [address] [place] [address] [phone]  [facility]  [address] [place] [address] [phone]  [facility] [address]  [address], [place] [address] [phone]  [facility]  [address] [place] [address] [phone]   Damita Dunnings, MSW, LCSW-A  11:43 AM 07/27/2021

## 2021-07-27 NOTE — Progress Notes (Signed)
Pt accepted to [facility]    Patient meets inpatient criteria per Liborio Nixon, NP   The attending provider will be Luberta Mutter, MD   Call report to [phone]  [place] Boney, RN @ [facility] ED notified.     Pt scheduled  to arrive at [facility] TODAY ANYTIME.    Damita Dunnings, MSW, LCSW-A  12:59 PM 07/27/2021

## 2021-07-27 NOTE — ED Notes (Signed)
Patient was given a cup of sprite.

## 2021-07-27 NOTE — ED Notes (Signed)
Herbert Seta pt wife number [phone]

## 2021-07-27 NOTE — ED Provider Notes (Signed)
Emergency Medicine Observation Re-evaluation Note  Randy Pratt is a 40 y.o. male, seen on rounds today.  Pt initially presented to the ED for complaints of IVC Currently, the patient is calmly sitting in bed watching TV.  Physical Exam  BP (!) 135/93 (BP Location: Left Arm)   Pulse (!) 58   Temp 97.8 F (36.6 C) (Oral)   Resp 20   Ht 6\' 1"  (1.854 m)   Wt 80 kg   SpO2 100%   BMI 23.27 kg/m  Physical Exam General: Calm, watching TV, NAD Cardiac: RRR, warm and well perfused Lungs: CTAB Psych: Calm, pleasant, cooperative  ED Course / MDM  EKG:EKG Interpretation  Date/Time:  Thursday July 26 2021 05:05:09 EST Ventricular Rate:  63 PR Interval:  186 QRS Duration: 90 QT Interval:  382 QTC Calculation: 390 R Axis:   93 Text Interpretation: Normal sinus rhythm Rightward axis Borderline ECG No old tracing to compare Confirmed by 12-02-2004 (Dione Booze) on 07/26/2021 5:27:30 AM  I have reviewed the labs performed to date as well as medications administered while in observation.  Recent changes in the last 24 hours include patient talking to themselves per nursing staff.  Plan  Current plan is for placement [facility] at [facility]. [facility] is under involuntary commitment.      [name], PA-C 07/27/21 1112    14/09/22, MD 07/27/21 1650

## 2021-07-27 NOTE — ED Notes (Signed)
Pt remained calm and cooperative throughout the night.

## 2021-09-08 ENCOUNTER — Encounter (HOSPITAL_COMMUNITY): Payer: Self-pay | Admitting: Emergency Medicine

## 2021-09-08 ENCOUNTER — Other Ambulatory Visit: Payer: Self-pay

## 2021-09-08 ENCOUNTER — Emergency Department (HOSPITAL_COMMUNITY)
Admission: EM | Admit: 2021-09-08 | Discharge: 2021-09-09 | Disposition: A | Discharge status: Home / Self Care | Payer: Commercial Managed Care - HMO | Attending: Emergency Medicine | Admitting: Emergency Medicine

## 2021-09-08 DIAGNOSIS — X501XXA Overexertion from prolonged static or awkward postures, initial encounter: Secondary | ICD-10-CM | POA: Insufficient documentation

## 2021-09-08 DIAGNOSIS — M25571 Pain in right ankle and joints of right foot: Secondary | ICD-10-CM | POA: Insufficient documentation

## 2021-09-08 DIAGNOSIS — S93401A Sprain of unspecified ligament of right ankle, initial encounter: Secondary | ICD-10-CM | POA: Diagnosis not present

## 2021-09-08 DIAGNOSIS — S99911A Unspecified injury of right ankle, initial encounter: Secondary | ICD-10-CM | POA: Diagnosis present

## 2021-09-08 NOTE — ED Triage Notes (Signed)
Pt states he thinks he "twisted" his R ankle.

## 2021-09-09 ENCOUNTER — Emergency Department (HOSPITAL_COMMUNITY): Payer: Commercial Managed Care - HMO

## 2021-09-09 DIAGNOSIS — S93401A Sprain of unspecified ligament of right ankle, initial encounter: Secondary | ICD-10-CM | POA: Diagnosis not present

## 2021-09-09 NOTE — ED Provider Notes (Signed)
[facility] EMERGENCY DEPARTMENT Provider Note   CSN: [id number] Arrival date & time: 09/08/21  2345     History  Chief Complaint  Patient presents with   Ankle Pain    Randy Pratt is a 41 y.o. male.  The history is provided by the patient.  Ankle Pain He injured his right ankle this afternoon.  He was in the boot and he thinks his ankle twisted but he cannot remember the exact mechanism of injury.  He is complaining of pain along the lateral aspect of the right ankle.  He is able to bear weight, but he will sometimes have sharp pains in his ankle if he moves the wrong way.  He denies other injury.   Home Medications Prior to Admission medications   Medication Sig Start Date End Date Taking? Authorizing Provider  acetaminophen (TYLENOL) 325 MG tablet Take 325 mg by mouth every 6 (six) hours as needed for moderate pain or headache.    [provider]  HYDROcodone-acetaminophen (NORCO/VICODIN) 5-325 MG tablet Take 1 tablet by mouth every 6 (six) hours as needed for moderate pain or severe pain. Patient not taking: Reported on 07/26/2021 05/05/20   Linus Mako B, NP  ibuprofen (ADVIL) 800 MG tablet Take 1 tablet (800 mg total) by mouth every 8 (eight) hours as needed. Patient not taking: Reported on 07/26/2021 05/05/20   Georgetta Haber, NP  multivitamin (ONE-A-DAY MEN'S) TABS tablet Take 1 tablet by mouth daily.    [provider]  cetirizine (ZYRTEC ALLERGY) 10 MG tablet Take 1 tablet (10 mg total) by mouth daily. 11/08/19 11/12/19  Avegno, Zachery Dakins, FNP  fluticasone (FLONASE) 50 MCG/ACT nasal spray Place 1 spray into both nostrils daily for 14 days. 11/08/19 11/12/19  Durward Parcel, FNP      Allergies    Patient has no known allergies.    Review of Systems   Review of Systems  All other systems reviewed and are negative.  Physical Exam Updated Vital Signs BP 102/70 (BP Location: Right Arm)    Pulse (!) 102    Temp 99.2 F (37.3 C) (Oral)    Resp 18     Ht 6\' 1"  (1.854 m)    Wt 72.6 kg    SpO2 99%    BMI 21.11 kg/m  Physical Exam Vitals and nursing note reviewed.  41 year old male, resting comfortably and in no acute distress. Vital signs are significant for borderline elevated heart rate. Oxygen saturation is 99%, which is normal. Head is normocephalic and atraumatic. PERRLA, EOMI.  Lungs are clear without rales, wheezes, or rhonchi. Chest is nontender. Heart has regular rate and rhythm without murmur. Abdomen is soft, flat, nontender. Extremities: There is mild swelling of the lateral aspect of the right ankle.  There is moderate tenderness of the left ankle over the fibulotalar ligament.  There is no instability of the ankle mortise, anterior drawer sign is negative.  Dorsalis pedis pulses strong, and there is prompt capillary refill. Skin is warm and dry without rash. Neurologic: Mental status is normal, cranial nerves are intact, moves all extremities equally.  ED Results / Procedures / Treatments    Radiology DG Ankle Complete Right  Result Date: 09/09/2021 CLINICAL DATA:  Right ankle pain. EXAM: RIGHT ANKLE - COMPLETE 3+ VIEW COMPARISON:  Right foot radiograph dated 01/24/2020. FINDINGS: There is no evidence of fracture, dislocation, or joint effusion. There is no evidence of arthropathy or other focal bone abnormality. Soft tissues are unremarkable.  IMPRESSION: Negative. Electronically Signed   By: Elgie Collard M.D.   On: 09/09/2021 00:19    Procedures .Ortho Injury Treatment  Date/Time: 09/09/2021 12:36 AM Performed by: Dione Booze, MD Authorized by: Dione Booze, MD   Consent:    Consent obtained:  Verbal   Consent given by:  Patient   Risks discussed:  Restricted joint movement   Alternatives discussed:  No treatmentInjury location: ankle Location details: right ankle Injury type: soft tissue Pre-procedure neurovascular assessment: neurovascularly intact Pre-procedure distal perfusion: normal Pre-procedure  neurological function: normal Pre-procedure range of motion: normal  Anesthesia: Local anesthesia used: no  Patient sedated: NoImmobilization: brace Splint Applied by: ED Nurse Supplies used: Ankle splint orthotic. Post-procedure neurovascular assessment: post-procedure neurovascularly intact Post-procedure distal perfusion: normal Post-procedure neurological function: normal Post-procedure range of motion: unchanged      Medications Ordered in ED Medications - No data to display  ED Course/ Medical Decision Making/ A&P                           Medical Decision Making Amount and/or Complexity of Data Reviewed Radiology: ordered.   Right ankle injury.  He is sent for x-rays to rule out fracture.  X-rays show no evidence of fracture.  I have independently viewed the images, and agree with the radiologist interpretation.  Ankle splint orthotic is applied and he is discharged with instructions on ice and elevation, told to use over-the-counter analgesics as needed for pain.  Referred to orthopedics for follow-up.        Final Clinical Impression(s) / ED Diagnoses Final diagnoses:  Sprain of right ankle, unspecified ligament, initial encounter    Rx / DC Orders ED Discharge Orders     None         Dione Booze, MD 09/09/21 [phone]

## 2021-09-09 NOTE — Discharge Instructions (Addendum)
Apply ice for 30 minutes at a time, 4 times a day.  Wear the ankle splint orthotic as needed.  Take ibuprofen and/your acetaminophen as needed for pain.  Please note that if you combine ibuprofen and acetaminophen, you get better pain relief than you get by taking either medication by itself.

## 2021-09-22 IMAGING — DX DG FOOT COMPLETE 3+V*R*
3 series · 3 of 3 positions shown · non-contrast
Comparison: 01/22/2018

CLINICAL DATA: Jammed toe. Pain, swelling, and bruising primarily
involving the fifth digit.

EXAM:
RIGHT FOOT COMPLETE - 3+ VIEW

[foot ap]
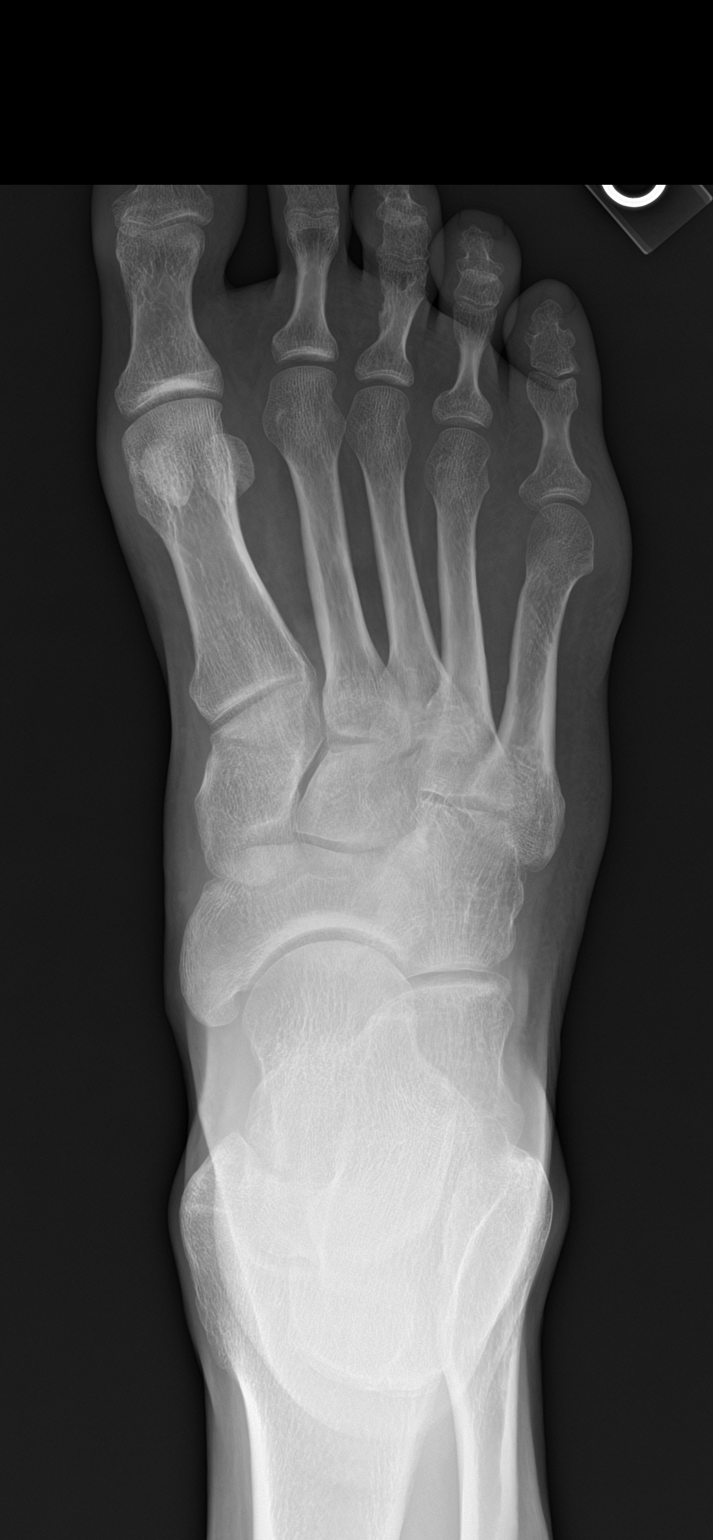

[foot obl]
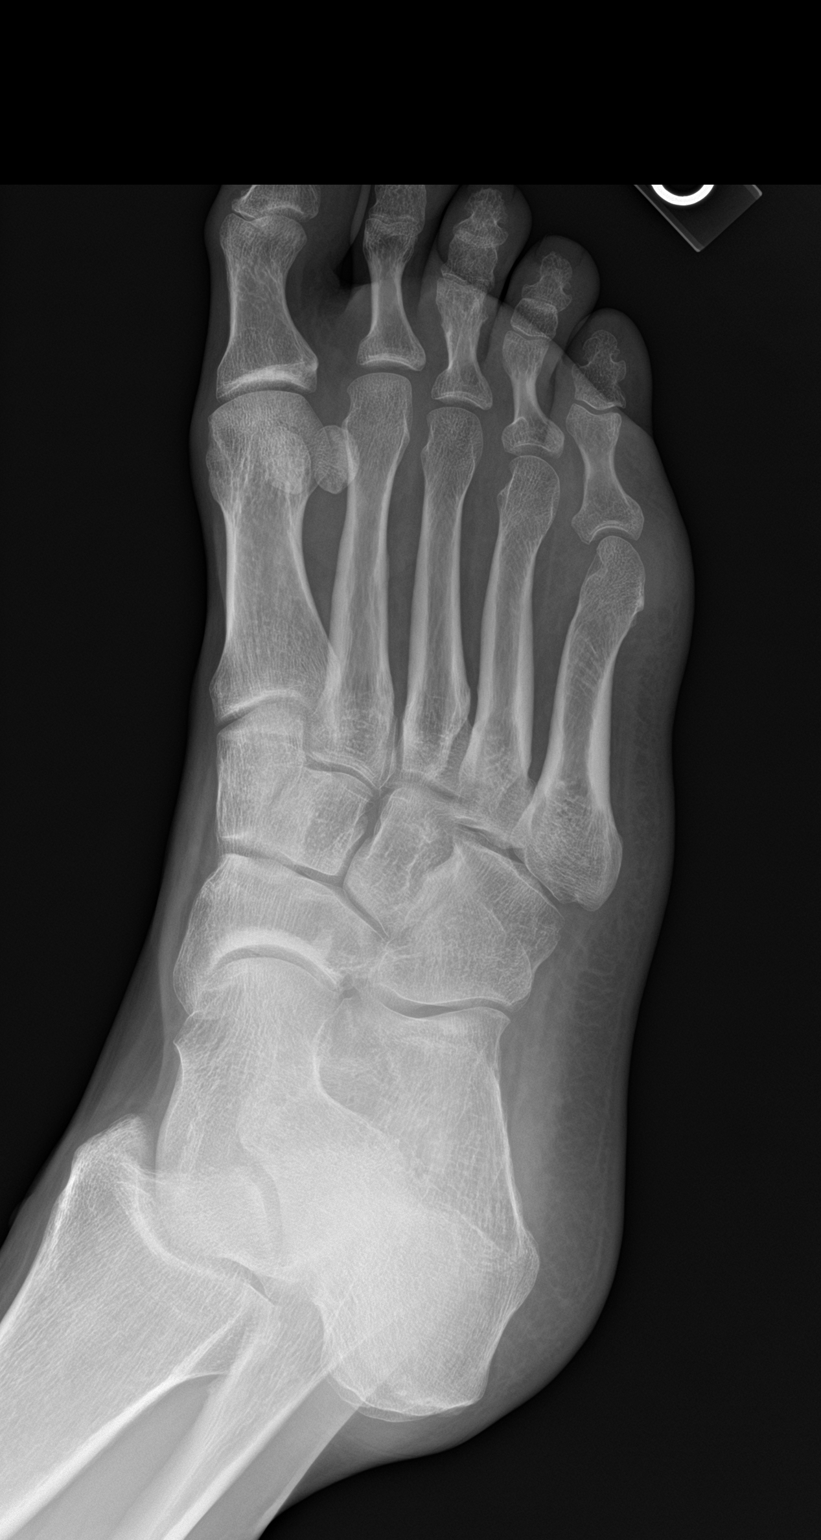

[foot lat]
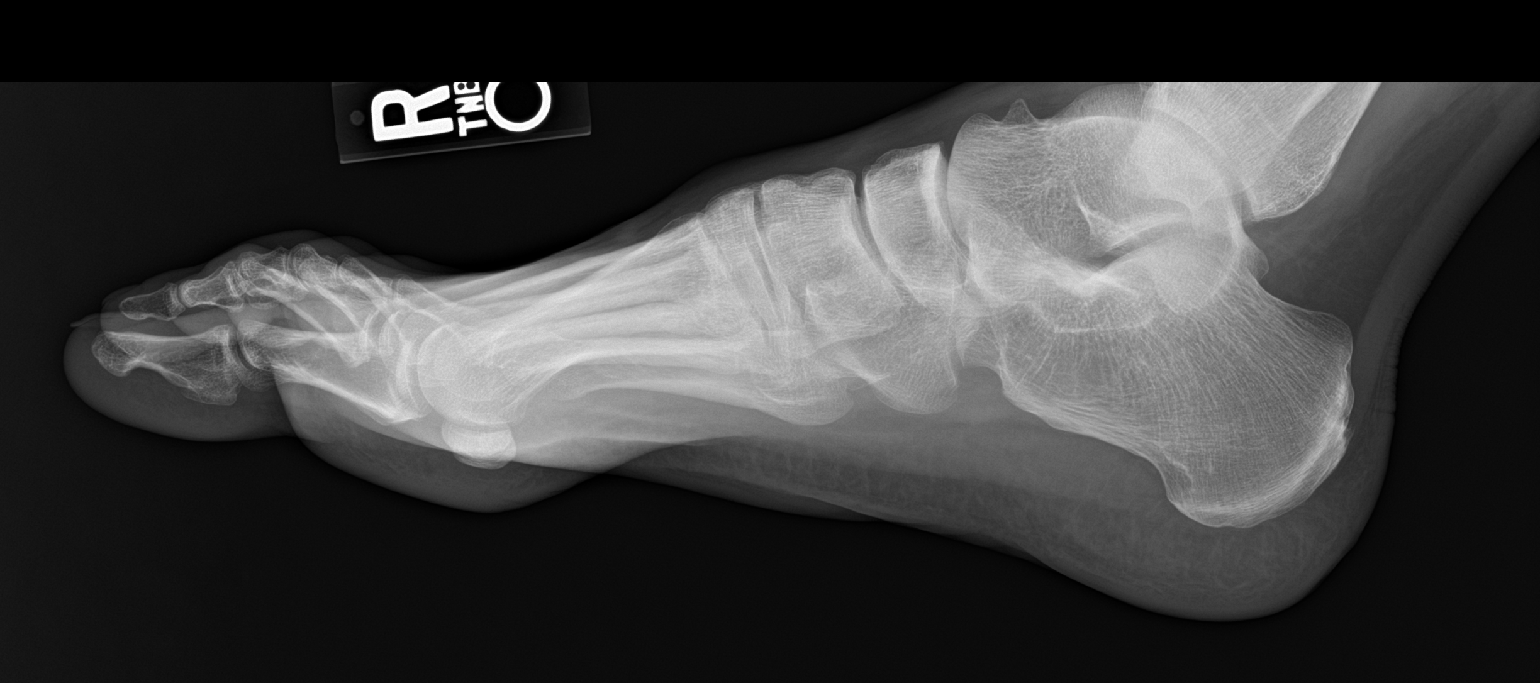

[3 of 3 positions shown; findings below may reference images not displayed]

FINDINGS: No fracture or dislocation is identified. Joint space widths are
preserved. There is soft tissue swelling about the fifth MTP joint.
IMPRESSION: Soft tissue swelling without acute osseous abnormality.

## 2022-02-21 ENCOUNTER — Emergency Department (HOSPITAL_COMMUNITY): Payer: Medicaid Other

## 2022-02-21 ENCOUNTER — Emergency Department (HOSPITAL_COMMUNITY)
Admission: EM | Admit: 2022-02-21 | Discharge: 2022-02-22 | Disposition: A | Discharge status: Home / Self Care | Payer: Medicaid Other | Attending: Emergency Medicine | Admitting: Emergency Medicine

## 2022-02-21 ENCOUNTER — Other Ambulatory Visit: Payer: Self-pay

## 2022-02-21 ENCOUNTER — Encounter (HOSPITAL_COMMUNITY): Payer: Self-pay

## 2022-02-21 DIAGNOSIS — R140 Abdominal distension (gaseous): Secondary | ICD-10-CM | POA: Insufficient documentation

## 2022-02-21 DIAGNOSIS — R569 Unspecified convulsions: Secondary | ICD-10-CM | POA: Diagnosis present

## 2022-02-21 HISTORY — DX: Unspecified intracranial injury with loss of consciousness status unknown, initial encounter: S06.9XAA

## 2022-02-21 LAB — CBC
HCT: 49.6 % (ref 39.0–52.0)
Hemoglobin: 16 g/dL (ref 13.0–17.0)
MCH: 25.6 pg — ABNORMAL LOW (ref 26.0–34.0)
MCHC: 32.3 g/dL (ref 30.0–36.0)
MCV: 79.2 fL — ABNORMAL LOW (ref 80.0–100.0)
Platelets: 278 10*3/uL (ref 150–400)
RBC: 6.26 MIL/uL — ABNORMAL HIGH (ref 4.22–5.81)
RDW: 14.6 % (ref 11.5–15.5)
WBC: 15.8 10*3/uL — ABNORMAL HIGH (ref 4.0–10.5)
nRBC: 0 % (ref 0.0–0.2)

## 2022-02-21 LAB — BASIC METABOLIC PANEL
Anion gap: 7 (ref 5–15)
BUN: 11 mg/dL (ref 6–20)
CO2: 27 mmol/L (ref 22–32)
Calcium: 9.6 mg/dL (ref 8.9–10.3)
Chloride: 107 mmol/L (ref 98–111)
Creatinine, Ser: 1.05 mg/dL (ref 0.61–1.24)
GFR, Estimated: >60 mL/min (ref >60)
Glucose, Bld: 98 mg/dL (ref 70–99)
Potassium: 4.1 mmol/L (ref 3.5–5.1)
Sodium: 141 mmol/L (ref 135–145)

## 2022-02-21 MED ORDER — LEVETIRACETAM IN NACL 1000 MG/100ML IV SOLN
1000.0000 mg | Freq: Once | INTRAVENOUS | Status: AC
Start: 2022-02-21 — End: 2022-02-21
  Administered 2022-02-21: 1000 mg via INTRAVENOUS
  Filled 2022-02-21: qty 100
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
## 2022-02-21 NOTE — ED Triage Notes (Signed)
Pt presents with reports of having a sz today and fell down 2 steps. Pt reports lower back pain. 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 Pt reports having 1st sz 2 weeks prior. Pt is to follow up with neurologist at [facility].
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
## 2022-02-22 MED ORDER — OXYCODONE HCL 5 MG PO TABS: 5.0000 mg | ORAL_TABLET | ORAL | 0 refills | Status: DC | PRN

## 2022-02-22 MED ORDER — HYDROCODONE-ACETAMINOPHEN 5-325 MG PO TABS
2.0000 | ORAL_TABLET | Freq: Once | ORAL | Status: AC
  Administered 2022-02-22: 2 via ORAL
  Filled 2022-02-22: qty 2

## 2022-02-22 MED ORDER — LEVETIRACETAM 500 MG PO TABS: 500.0000 mg | ORAL_TABLET | Freq: Two times a day (BID) | ORAL | 0 refills | Status: DC

## 2022-02-22 NOTE — Discharge Instructions (Addendum)
It appears that you have had at least 2 seizures.  We are starting you on medication, Keppra to prevent further seizures.  We cannot always eliminate seizures with this medicine.  Do not bathe alone, climb ladders, operate machinery or vehicles until you get cleared by neurology.  Let your doctors at [facility] know that we started you on seizure medicine.  We also give you a few pain pills to help with your discomfort.  These were sent to your pharmacy.

## 2022-03-01 ENCOUNTER — Telehealth (HOSPITAL_COMMUNITY): Payer: Self-pay | Admitting: Student

## 2022-03-01 MED ORDER — OXYCODONE HCL 5 MG PO TABS: 5.0000 mg | ORAL_TABLET | ORAL | 0 refills | Status: DC | PRN

## 2022-03-01 NOTE — Telephone Encounter (Signed)
Note created to change oxycodone prescription location

## 2022-10-09 ENCOUNTER — Ambulatory Visit: Payer: Self-pay | Admitting: Adult Health

## 2022-11-14 ENCOUNTER — Encounter: Payer: Self-pay | Admitting: Adult Health

## 2022-11-14 NOTE — Progress Notes (Signed)
This encounter was created in error - please disregard.

## 2022-11-28 ENCOUNTER — Ambulatory Visit: Payer: Medicaid Other | Admitting: Adult Health

## 2022-12-20 ENCOUNTER — Encounter: Payer: Self-pay | Admitting: Adult Health

## 2022-12-20 ENCOUNTER — Ambulatory Visit (INDEPENDENT_AMBULATORY_CARE_PROVIDER_SITE_OTHER): Payer: Medicaid Other | Admitting: Adult Health

## 2022-12-20 VITALS — BP 121/88 | HR 99 | Temp 98.4°F | Resp 18 | Ht 73.0 in | Wt 170.4 lb

## 2022-12-20 DIAGNOSIS — F319 Bipolar disorder, unspecified: Secondary | ICD-10-CM | POA: Diagnosis not present

## 2022-12-20 DIAGNOSIS — Z113 Encounter for screening for infections with a predominantly sexual mode of transmission: Secondary | ICD-10-CM | POA: Diagnosis not present

## 2022-12-20 DIAGNOSIS — M542 Cervicalgia: Secondary | ICD-10-CM

## 2022-12-20 DIAGNOSIS — Z7689 Persons encountering health services in other specified circumstances: Secondary | ICD-10-CM

## 2022-12-20 DIAGNOSIS — R69 Illness, unspecified: Secondary | ICD-10-CM

## 2022-12-20 DIAGNOSIS — R569 Unspecified convulsions: Secondary | ICD-10-CM

## 2022-12-20 MED ORDER — LEVETIRACETAM 1000 MG PO TABS: 1000.0000 mg | ORAL_TABLET | Freq: Two times a day (BID) | ORAL | 3 refills | Status: DC

## 2022-12-20 MED ORDER — ACETAMINOPHEN 500 MG PO TABS: 1000.0000 mg | ORAL_TABLET | Freq: Three times a day (TID) | ORAL | 6 refills | Status: AC | PRN

## 2022-12-20 MED ORDER — SERTRALINE HCL 50 MG PO TABS: 50.0000 mg | ORAL_TABLET | Freq: Every day | ORAL | 3 refills | Status: DC
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
## 2022-12-20 NOTE — Patient Instructions (Signed)

## 2022-12-22 NOTE — Progress Notes (Signed)
-    hgb 17.3, slightly elevated (normal 13.2 - 17.1), will repeat on follow up -  Ca 10.5, elevated (normal 8.6 to 10.3), will repeat on follow up -  A1C 5.7, ranging in prediabetes, eat low carb diet, avoid sweets, increase vegetable intake -  HIV and Hep C antibody negative -  lipid panel

## 2022-12-25 LAB — HEMOGLOBIN A1C
Hgb A1c MFr Bld: 5.7 % of total Hgb — ABNORMAL HIGH (ref <5.7)
Mean Plasma Glucose: 117 mg/dL
eAG (mmol/L): 6.5 mmol/L

## 2022-12-25 LAB — CBC WITH DIFFERENTIAL/PLATELET
Absolute Monocytes: 754 cells/uL (ref 200–950)
Basophils Absolute: 49 cells/uL (ref 0–200)
Basophils Relative: 0.6 %
Eosinophils Absolute: 139 cells/uL (ref 15–500)
Eosinophils Relative: 1.7 %
HCT: 52.6 % — ABNORMAL HIGH (ref 38.5–50.0)
Hemoglobin: 17.3 g/dL — ABNORMAL HIGH (ref 13.2–17.1)
Lymphs Abs: 1837 cells/uL (ref 850–3900)
MCH: 27.9 pg (ref 27.0–33.0)
MCHC: 32.9 g/dL (ref 32.0–36.0)
MCV: 84.7 fL (ref 80.0–100.0)
MPV: 10.1 fL (ref 7.5–12.5)
Monocytes Relative: 9.2 %
Neutro Abs: 5420 cells/uL (ref 1500–7800)
Neutrophils Relative %: 66.1 %
Platelets: 306 10*3/uL (ref 140–400)
RBC: 6.21 10*6/uL — ABNORMAL HIGH (ref 4.20–5.80)
RDW: 13.5 % (ref 11.0–15.0)
Total Lymphocyte: 22.4 %
WBC: 8.2 10*3/uL (ref 3.8–10.8)

## 2022-12-25 LAB — COMPLETE METABOLIC PANEL WITH GFR
AG Ratio: 2.2 (calc) (ref 1.0–2.5)
ALT: 16 U/L (ref 9–46)
AST: 12 U/L (ref 10–40)
Albumin: 4.8 g/dL (ref 3.6–5.1)
Alkaline phosphatase (APISO): 69 U/L (ref 36–130)
BUN: 11 mg/dL (ref 7–25)
CO2: 27 mmol/L (ref 20–32)
Calcium: 10.5 mg/dL — ABNORMAL HIGH (ref 8.6–10.3)
Chloride: 104 mmol/L (ref 98–110)
Creat: 0.99 mg/dL (ref 0.60–1.29)
Globulin: 2.2 g/dL (calc) (ref 1.9–3.7)
Glucose, Bld: 92 mg/dL (ref 65–99)
Potassium: 4.2 mmol/L (ref 3.5–5.3)
Sodium: 140 mmol/L (ref 135–146)
Total Bilirubin: 0.5 mg/dL (ref 0.2–1.2)
Total Protein: 7 g/dL (ref 6.1–8.1)
eGFR: 98 mL/min/{1.73_m2} (ref >=60)

## 2022-12-25 LAB — LIPID PANEL
Cholesterol: 174 mg/dL (ref <200)
HDL: 66 mg/dL (ref >=40)
LDL Cholesterol (Calc): 88 mg/dL (calc)
Non-HDL Cholesterol (Calc): 108 mg/dL (calc) (ref <130)
Total CHOL/HDL Ratio: 2.6 (calc) (ref <5.0)
Triglycerides: 107 mg/dL (ref <150)

## 2022-12-25 LAB — LEVETIRACETAM LEVEL: Keppra (Levetiracetam): 34 ug/mL

## 2022-12-25 LAB — HIV ANTIBODY (ROUTINE TESTING W REFLEX): HIV 1&2 Ab, 4th Generation: NONREACTIVE

## 2022-12-25 LAB — HEPATITIS C ANTIBODY: Hepatitis C Ab: NONREACTIVE

## 2023-01-02 ENCOUNTER — Ambulatory Visit
Admission: RE | Admit: 2023-01-02 | Discharge: 2023-01-02 | Disposition: A | Discharge status: Home / Self Care | Payer: Medicaid Other | Source: Ambulatory Visit | Attending: Adult Health | Admitting: Adult Health

## 2023-01-02 DIAGNOSIS — M542 Cervicalgia: Secondary | ICD-10-CM

## 2023-01-08 ENCOUNTER — Telehealth: Payer: Self-pay

## 2023-01-08 ENCOUNTER — Encounter: Payer: Self-pay | Admitting: Adult Health

## 2023-01-08 NOTE — Progress Notes (Signed)
-    cervical spine imaging  normal

## 2023-01-08 NOTE — Telephone Encounter (Signed)
What is hurting> Did he follow up with neurologist yet?

## 2023-01-08 NOTE — Telephone Encounter (Signed)
Patient's wife called stating that patient is still having a lot of pain and would like to know if there is something that can be called in for the pain. She also stated that the patient has been having more frequent seizures despite the Keppra being increased. She states that the patient says that he "feels dumber". She would like to discuss seizures pain and some other concerns that she has regarding patient. She has scheduled an appointment for patient with you for Friday,but still would like to know abut getting something for the pain.  Message sent to Kenard Gower, NP

## 2023-01-10 ENCOUNTER — Ambulatory Visit (INDEPENDENT_AMBULATORY_CARE_PROVIDER_SITE_OTHER): Payer: Medicaid Other | Admitting: Adult Health

## 2023-01-10 ENCOUNTER — Encounter: Payer: Self-pay | Admitting: Adult Health

## 2023-01-10 VITALS — BP 127/88 | HR 95 | Temp 98.6°F | Resp 18 | Ht 73.0 in | Wt 168.6 lb

## 2023-01-10 DIAGNOSIS — R7303 Prediabetes: Secondary | ICD-10-CM

## 2023-01-10 DIAGNOSIS — R569 Unspecified convulsions: Secondary | ICD-10-CM | POA: Diagnosis not present

## 2023-01-10 DIAGNOSIS — M542 Cervicalgia: Secondary | ICD-10-CM

## 2023-01-10 DIAGNOSIS — R519 Headache, unspecified: Secondary | ICD-10-CM | POA: Diagnosis not present

## 2023-01-10 DIAGNOSIS — G8929 Other chronic pain: Secondary | ICD-10-CM

## 2023-01-10 DIAGNOSIS — F319 Bipolar disorder, unspecified: Secondary | ICD-10-CM

## 2023-01-10 MED ORDER — GABAPENTIN 100 MG PO CAPS: 100.0000 mg | ORAL_CAPSULE | Freq: Two times a day (BID) | ORAL | 3 refills | Status: DC

## 2023-01-10 MED ORDER — METHOCARBAMOL 500 MG PO TABS: 500.0000 mg | ORAL_TABLET | Freq: Three times a day (TID) | ORAL | 3 refills | Status: DC | PRN

## 2023-01-10 MED ORDER — METHOCARBAMOL 500 MG PO TABS
500.0000 mg | ORAL_TABLET | Freq: Three times a day (TID) | ORAL | 3 refills | Status: DC | PRN
Start: 2023-01-10 — End: 2023-01-10
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
## 2023-01-27 ENCOUNTER — Ambulatory Visit: Payer: Medicaid Other | Admitting: Adult Health

## 2023-02-17 ENCOUNTER — Encounter: Payer: MEDICAID | Admitting: Adult Health

## 2023-02-17 NOTE — Progress Notes (Signed)
This encounter was created in error - please disregard.

## 2023-04-03 ENCOUNTER — Ambulatory Visit: Payer: Medicaid Other | Admitting: Neurology

## 2023-04-03 ENCOUNTER — Encounter: Payer: Self-pay | Admitting: Neurology

## 2023-06-18 ENCOUNTER — Emergency Department (HOSPITAL_COMMUNITY)
Admission: EM | Admit: 2023-06-18 | Discharge: 2023-06-18 | Attending: Emergency Medicine | Admitting: Emergency Medicine

## 2023-06-18 ENCOUNTER — Encounter (HOSPITAL_COMMUNITY): Payer: Self-pay

## 2023-06-18 DIAGNOSIS — G40909 Epilepsy, unspecified, not intractable, without status epilepticus: Secondary | ICD-10-CM | POA: Insufficient documentation

## 2023-06-18 DIAGNOSIS — R569 Unspecified convulsions: Secondary | ICD-10-CM

## 2023-06-18 DIAGNOSIS — Z79899 Other long term (current) drug therapy: Secondary | ICD-10-CM | POA: Insufficient documentation

## 2023-06-18 LAB — CBC WITH DIFFERENTIAL/PLATELET
Abs Immature Granulocytes: 0.08 10*3/uL — ABNORMAL HIGH (ref 0.00–0.07)
Basophils Absolute: 0 10*3/uL (ref 0.0–0.1)
Basophils Relative: 0 %
Eosinophils Absolute: 0.1 10*3/uL (ref 0.0–0.5)
Eosinophils Relative: 0 %
HCT: 41.8 % (ref 39.0–52.0)
Hemoglobin: 14.9 g/dL (ref 13.0–17.0)
Immature Granulocytes: 1 %
Lymphocytes Relative: 11 %
Lymphs Abs: 1.6 10*3/uL (ref 0.7–4.0)
MCH: 28.8 pg (ref 26.0–34.0)
MCHC: 35.6 g/dL (ref 30.0–36.0)
MCV: 80.9 fL (ref 80.0–100.0)
Monocytes Absolute: 1.1 10*3/uL — ABNORMAL HIGH (ref 0.1–1.0)
Monocytes Relative: 7 %
Neutro Abs: 11.8 10*3/uL — ABNORMAL HIGH (ref 1.7–7.7)
Neutrophils Relative %: 81 %
Platelets: 229 10*3/uL (ref 150–400)
RBC: 5.17 MIL/uL (ref 4.22–5.81)
RDW: 13.2 % (ref 11.5–15.5)
WBC: 14.7 10*3/uL — ABNORMAL HIGH (ref 4.0–10.5)
nRBC: 0 % (ref 0.0–0.2)

## 2023-06-18 LAB — COMPREHENSIVE METABOLIC PANEL
ALT: 42 U/L (ref 0–44)
AST: 39 U/L (ref 15–41)
Albumin: 4.4 g/dL (ref 3.5–5.0)
Alkaline Phosphatase: 65 U/L (ref 38–126)
Anion gap: 9 (ref 5–15)
BUN: 14 mg/dL (ref 6–20)
CO2: 23 mmol/L (ref 22–32)
Calcium: 9.1 mg/dL (ref 8.9–10.3)
Chloride: 107 mmol/L (ref 98–111)
Creatinine, Ser: 1.09 mg/dL (ref 0.61–1.24)
GFR, Estimated: >60 mL/min (ref >60)
Glucose, Bld: 107 mg/dL — ABNORMAL HIGH (ref 70–99)
Potassium: 3.6 mmol/L (ref 3.5–5.1)
Sodium: 139 mmol/L (ref 135–145)
Total Bilirubin: 0.7 mg/dL (ref 0.3–1.2)
Total Protein: 6.9 g/dL (ref 6.5–8.1)

## 2023-06-18 MED ORDER — SODIUM CHLORIDE 0.9 % IV BOLUS
1000.0000 mL | Freq: Once | INTRAVENOUS | Status: AC
  Administered 2023-06-18: 1000 mL via INTRAVENOUS

## 2023-06-18 MED ORDER — LEVETIRACETAM IN NACL 1500 MG/100ML IV SOLN
1500.0000 mg | Freq: Once | INTRAVENOUS | Status: AC
  Administered 2023-06-18: 1500 mg via INTRAVENOUS
  Filled 2023-06-18: qty 100

## 2023-06-18 MED ORDER — METOCLOPRAMIDE HCL 5 MG/ML IJ SOLN
10.0000 mg | Freq: Once | INTRAMUSCULAR | Status: AC
  Administered 2023-06-18: 10 mg via INTRAVENOUS
  Filled 2023-06-18: qty 2

## 2023-06-18 MED ORDER — DIPHENHYDRAMINE HCL 50 MG/ML IJ SOLN
25.0000 mg | Freq: Once | INTRAMUSCULAR | Status: AC
Start: 2023-06-18 — End: 2023-06-18
  Administered 2023-06-18: 25 mg via INTRAVENOUS
  Filled 2023-06-18: qty 1

## 2023-06-18 MED ORDER — LEVETIRACETAM 750 MG PO TABS: 1500.0000 mg | ORAL_TABLET | Freq: Two times a day (BID) | ORAL | 0 refills | Status: DC

## 2023-06-18 MED ORDER — ACETAMINOPHEN 325 MG PO TABS
650.0000 mg | ORAL_TABLET | Freq: Once | ORAL | Status: AC
  Administered 2023-06-18: 650 mg via ORAL
  Filled 2023-06-18: qty 2

## 2023-06-18 NOTE — ED Triage Notes (Signed)
Pt biba for witnessed sz. Patient in custody, brought from jail. Patient takes his sz medication and is complaint. MD Silverio Lay at bedside

## 2023-06-18 NOTE — ED Provider Notes (Signed)
EMERGENCY DEPARTMENT AT [facility] Provider Note   CSN: [id number] Arrival date & time: 06/18/23  1859     History  Chief Complaint  Patient presents with   Seizures    Randy Pratt is a 42 y.o. male history of seizure here presenting with another seizure.  Patient has been in jail for the last 5 months.  Patient has been taking Keppra 1000 mg twice daily.  He states that he is compliant with it.  He states that he was working in [employer] and felt a seizure coming on.  He then laid down on the bathroom floor and had a witnessed tonic-clonic seizure.  EMS was called and he is back to baseline now.  He has a history of seizures.  The history is provided by the patient.       Home Medications Prior to Admission medications   Medication Sig Start Date End Date Taking? Authorizing Provider  acetaminophen (TYLENOL) 500 MG tablet Take 2 tablets (1,000 mg total) by mouth every 8 (eight) hours as needed. 12/20/22   Medina-Vargas, Monina C, NP  gabapentin (NEURONTIN) 100 MG capsule Take 1 capsule (100 mg total) by mouth 2 (two) times daily. 01/10/23   Medina-Vargas, Monina C, NP  levETIRAcetam (KEPPRA) 1000 MG tablet Take 1 tablet (1,000 mg total) by mouth 2 (two) times daily. 12/20/22   Medina-Vargas, Monina C, NP  methocarbamol (ROBAXIN) 500 MG tablet Take 1 tablet (500 mg total) by mouth every 8 (eight) hours as needed for muscle spasms. 01/10/23   Medina-Vargas, Monina C, NP  sertraline (ZOLOFT) 50 MG tablet Take 1 tablet (50 mg total) by mouth at bedtime. 12/20/22   Medina-Vargas, Monina C, NP  cetirizine (ZYRTEC ALLERGY) 10 MG tablet Take 1 tablet (10 mg total) by mouth daily. 11/08/19 11/12/19  Avegno, Zachery Dakins, FNP  fluticasone (FLONASE) 50 MCG/ACT nasal spray Place 1 spray into both nostrils daily for 14 days. 11/08/19 11/12/19  Durward Parcel, FNP      Allergies    Patient has no known allergies.    Review of Systems   Review of Systems  Neurological:   Positive for seizures.  All other systems reviewed and are negative.   Physical Exam Updated Vital Signs BP 127/81 (BP Location: Right Arm)   Pulse (!) 119   Temp 98.7 F (37.1 C) (Oral)   Resp 18   Ht 6' (1.829 m)   Wt 76.5 kg   SpO2 95%   BMI 22.87 kg/m  Physical Exam Vitals and nursing note reviewed.  Constitutional:      Appearance: Normal appearance.  HENT:     Head: Normocephalic.     Comments: No obvious scalp hematoma or signs of head injury    Mouth/Throat:     Mouth: Mucous membranes are moist.  Eyes:     Extraocular Movements: Extraocular movements intact.     Pupils: Pupils are equal, round, and reactive to light.  Cardiovascular:     Rate and Rhythm: Normal rate and regular rhythm.     Pulses: Normal pulses.     Heart sounds: Normal heart sounds.  Pulmonary:     Effort: Pulmonary effort is normal.     Breath sounds: Normal breath sounds.  Abdominal:     General: Abdomen is flat.     Palpations: Abdomen is soft.  Musculoskeletal:        General: Normal range of motion.     Cervical back: Normal range of motion  and neck supple.  Skin:    General: Skin is warm.     Capillary Refill: Capillary refill takes less than 2 seconds.  Neurological:     General: No focal deficit present.     Mental Status: He is alert and oriented to person, place, and time.     Comments: No eye deviation.  Patient has normal strength bilateral arms and legs.  Psychiatric:        Mood and Affect: Mood normal.        Behavior: Behavior normal.     ED Results / Procedures / Treatments   Labs (all labs ordered are listed, but only abnormal results are displayed) Labs Reviewed  CBC WITH DIFFERENTIAL/PLATELET  COMPREHENSIVE METABOLIC PANEL    EKG None  Radiology No results found.  Procedures Procedures    Medications Ordered in ED Medications  levETIRAcetam (KEPPRA) IVPB 1500 mg/ 100 mL premix (has no administration in time range)  sodium chloride 0.9 % bolus  1,000 mL (has no administration in time range)    ED Course/ Medical Decision Making/ A&P                                 Medical Decision Making Randy Pratt is a 42 y.o. male here presenting with seizure.  Patient has a history of seizure.  He is compliant with his Keppra 1000 mg twice daily.  Will load with 1500 mg of Keppra.  He has no head injury so we will hold off on CT head.  Will get basic blood work.   10 pm Labs unremarkable. Back to baseline. Will increase keppra to 1500 mg BID. Will have him follow up with neurology   Amount and/or Complexity of Data Reviewed Labs: ordered. ECG/medicine tests: ordered.  Risk OTC drugs. Prescription drug management.    Final Clinical Impression(s) / ED Diagnoses Final diagnoses:  None    Rx / DC Orders ED Discharge Orders     None         Charlynne Pander, MD 06/20/23 [phone]

## 2023-06-18 NOTE — Discharge Instructions (Addendum)
You had a seizure so I increased your Keppra to 1500 mg twice daily.  You should follow-up with the neurologist  Return to ER if you have another seizure, headache

## 2023-06-19 ENCOUNTER — Telehealth: Payer: Self-pay

## 2023-06-19 NOTE — Transitions of Care (Post Inpatient/ED Visit) (Signed)
   06/19/2023  Name: Randy Pratt MRN: [id number] DOB: 1980/12/11  Today's TOC FU Call Status: Today's TOC FU Call Status:: Unsuccessful Call (1st Attempt) Unsuccessful Call (1st Attempt) Date: 06/19/23  Attempted to reach the patient regarding the most recent Inpatient/ED visit. Called patient and unable to leave voicemail.   Follow Up Plan: Additional outreach attempts will be made to reach the patient to complete the Transitions of Care (Post Inpatient/ED visit) call.   Signature: Dino Borntreger.D/RMA

## 2023-06-25 NOTE — Transitions of Care (Post Inpatient/ED Visit) (Signed)
   06/25/2023  Name: Randy Pratt MRN: [id number] DOB: [birth year]  Today's TOC FU Call Status: Today's TOC FU Call Status:: Unsuccessful Call (2nd Attempt) Unsuccessful Call (1st Attempt) Date: 06/19/23 Unsuccessful Call (2nd Attempt) Date: 06/25/23  Attempted to reach the patient regarding the most recent Inpatient/ED visit. Patient is unable to attend any other appointments until further notice.  Follow Up Plan: No further outreach attempts will be made at this time. We have been unable to contact the patient.  Signature: Nagi Furio.D/RMA

## 2023-10-14 ENCOUNTER — Encounter: Payer: Self-pay | Admitting: Nurse Practitioner

## 2023-10-14 ENCOUNTER — Telehealth: Payer: Self-pay | Admitting: *Deleted

## 2023-10-14 ENCOUNTER — Telehealth (INDEPENDENT_AMBULATORY_CARE_PROVIDER_SITE_OTHER): Payer: MEDICAID | Admitting: Nurse Practitioner

## 2023-10-14 DIAGNOSIS — F319 Bipolar disorder, unspecified: Secondary | ICD-10-CM

## 2023-10-14 DIAGNOSIS — Z5982 Transportation insecurity: Secondary | ICD-10-CM

## 2023-10-14 DIAGNOSIS — G43909 Migraine, unspecified, not intractable, without status migrainosus: Secondary | ICD-10-CM | POA: Diagnosis not present

## 2023-10-14 DIAGNOSIS — G40909 Epilepsy, unspecified, not intractable, without status epilepticus: Secondary | ICD-10-CM

## 2023-10-14 DIAGNOSIS — G8929 Other chronic pain: Secondary | ICD-10-CM | POA: Diagnosis not present

## 2023-10-14 DIAGNOSIS — R569 Unspecified convulsions: Secondary | ICD-10-CM

## 2023-10-14 MED ORDER — LEVETIRACETAM 750 MG PO TABS: 1500.0000 mg | ORAL_TABLET | Freq: Two times a day (BID) | ORAL | 0 refills | Status: DC

## 2023-10-14 NOTE — Progress Notes (Signed)
 Careteam: Patient Care Team: Medina-Vargas, Margit Banda, NP as PCP - General (Internal Medicine)  Advanced Directive information    No Known Allergies  Chief Complaint  Patient presents with   Medical Management of Chronic Issues    To Discuss Medications. In [place] for 6 Months and they increased the Keppra to 2000mg  every 8 hours.      Discussed the use of AI scribe software for clinical note transcription with the patient, who gave verbal consent to proceed.  History of Present Illness   Randy Pratt is a 43 year old male with a history of seizures who presents with concerns about his seizure medication dosage and recent seizure activity.  He is nearly out of his seizure medication, which was increased during incarceration due to severe seizures. He experienced severe seizures while incarcerated, leading to an increased dosage by the emergency department. He currently takes 2000 mg of Keppra every eight hours and is concerned about running out of medication. He last experienced a seizure about a week ago, which was mild compared to previous episodes characterized by uncontrollable shaking, loss of consciousness, and tongue biting. He has not followed up with a neurologist since his release from incarceration last month.  He has gabapentin 100 mg twice daily on medication, which he believes was for migraines but not currently taking. He experiences migraines one to two times per week, which are somewhat alleviated by Tylenol. He describes using Tylenol occasionally for migraine relief.  He has a history of bipolar depression but is not currently taking Zoloft or any other mood-stabilizing medications. His mood has been stable without any mood swings, mania, or anxiety.  He was recently released from incarceration last month and currently has difficulty with transportation.     Review of Systems:  Review of Systems  Constitutional:  Negative for chills, fever and weight loss.   HENT:  Negative for tinnitus.   Respiratory:  Negative for cough, sputum production and shortness of breath.   Cardiovascular:  Negative for chest pain, palpitations and leg swelling.  Gastrointestinal:  Negative for abdominal pain, constipation, diarrhea and heartburn.  Genitourinary:  Negative for dysuria, frequency and urgency.  Musculoskeletal:  Negative for back pain, falls, joint pain and myalgias.  Skin: Negative.   Neurological:  Positive for seizures and headaches. Negative for dizziness.  Psychiatric/Behavioral:  Negative for depression and memory loss. The patient does not have insomnia.     Past Medical History:  Diagnosis Date   Seizures (HCC)    Traumatic brain injury [facility])    Past Surgical History:  Procedure Laterality Date   CRANIOTOMY     HAND SURGERY  08/19/2010   L wrist, cut 8 tendons, 1 nerve and main artery   Social History:   reports that he has been smoking cigarettes. He has never used smokeless tobacco. He reports that he does not drink alcohol and does not use drugs.  Family History  Problem Relation Age of Onset   Healthy Mother    Healthy Father     Medications: Patient's Medications  New Prescriptions   No medications on file  Previous Medications   ACETAMINOPHEN (TYLENOL) 500 MG TABLET    Take 2 tablets (1,000 mg total) by mouth every 8 (eight) hours as needed.   GABAPENTIN (NEURONTIN) 100 MG CAPSULE    Take 1 capsule (100 mg total) by mouth 2 (two) times daily.   LEVETIRACETAM (KEPPRA) 1000 MG TABLET    Take 2,000 mg by mouth every  8 (eight) hours.   LEVETIRACETAM (KEPPRA) 750 MG TABLET    Take 2 tablets (1,500 mg total) by mouth 2 (two) times daily.   METHOCARBAMOL (ROBAXIN) 500 MG TABLET    Take 1 tablet (500 mg total) by mouth every 8 (eight) hours as needed for muscle spasms.   SERTRALINE (ZOLOFT) 50 MG TABLET    Take 1 tablet (50 mg total) by mouth at bedtime.  Modified Medications   No medications on file  Discontinued Medications    No medications on file    Physical Exam:  There were no vitals filed for this visit. There is no height or weight on file to calculate BMI. Wt Readings from Last 3 Encounters:  01/10/23 168 lb 9.6 oz (76.5 kg)  12/20/22 170 lb 6 oz (77.3 kg)  02/21/22 170 lb (77.1 kg)    Physical Exam Constitutional:      Appearance: Normal appearance.  Neurological:     Mental Status: He is alert. Mental status is at baseline.  Psychiatric:        Mood and Affect: Mood normal.     Labs reviewed: Basic Metabolic Panel: Recent Labs    12/20/22 1611 06/18/23 2007  NA 140 139  K 4.2 3.6  CL 104 107  CO2 27 23  GLUCOSE 92 107*  BUN 11 14  CREATININE 0.99 1.09  CALCIUM 10.5* 9.1   Liver Function Tests: Recent Labs    12/20/22 1611 06/18/23 2007  AST 12 39  ALT 16 42  ALKPHOS  --  65  BILITOT 0.5 0.7  PROT 7.0 6.9  ALBUMIN  --  4.4   No results for input(s): "LIPASE", "AMYLASE" in the last 8760 hours. No results for input(s): "AMMONIA" in the last 8760 hours. CBC: Recent Labs    12/20/22 1611 06/18/23 2007  WBC 8.2 14.7*  NEUTROABS 5,420 11.8*  HGB 17.3* 14.9  HCT 52.6* 41.8  MCV 84.7 80.9  PLT 306 229   Lipid Panel: Recent Labs    12/20/22 1611  CHOL 174  HDL 66  LDLCALC 88  TRIG 107  CHOLHDL 2.6   TSH: No results for input(s): "TSH" in the last 8760 hours. A1C: Lab Results  Component Value Date   HGBA1C 5.7 (H) 12/20/2022     Assessment/Plan Assessment and Plan    Seizure Disorder Patient reports increased seizure frequency while incarcerated, leading to an increase in Keppra dose. Currently taking a high dose (2000mg  every 8 hours) and is running out of medication. Last seizure was a week ago, but severity has decreased with increased dosage. -Refill Keppra at 750mg , two tablets twice daily (as per hospital discharge instructions). -Refer to neurology for further management and monitoring of medication levels. -Order blood work to monitor for  potential toxicity.  Migraines Patient reports experiencing one to two migraines per week, managed with occasional Tylenol. -neurology referral for management of migraines.  Bipolar Depression Patient has a history of bipolar depression but is not currently taking any mood stabilizers or seeing a psychiatrist. Reports mood has been stable. -Monitor for any changes in mood or mental health status.  Transportation Issues Patient reports difficulty with transportation, impacting ability to attend appointments. -Submit referral for transportation assistance to facilitate office visits and necessary follow-up.  General Health Maintenance / Followup Plans -Schedule office visit with primary care provider, Monina, for comprehensive follow-up and blood work.      Janene Harvey. Biagio Borg  [facility] [phone]  Virtual Visit via video  I connected with patient on 10/14/23 at 11:00 AM EST by mychart and verified that I am speaking with the correct person using two identifiers.  Location: Patient: home Provider: [facility]   I discussed the limitations, risks, security and privacy concerns of performing an evaluation and management service by telephone and the availability of in person appointments. I also discussed with the patient that there may be a patient responsible charge related to this service. The patient expressed understanding and agreed to proceed.   I discussed the assessment and treatment plan with the patient. The patient was provided an opportunity to ask questions and all were answered. The patient agreed with the plan and demonstrated an understanding of the instructions.   The patient was advised to call back or seek an in-person evaluation if the symptoms worsen or if the condition fails to improve as anticipated.  I provided 15 minutes of non-face-to-face time during this encounter.  Janene Harvey. Biagio Borg Avs printed and  mailed

## 2023-10-14 NOTE — Progress Notes (Signed)
 This service is provided via telemedicine  No vital signs collected/recorded due to the encounter was a telemedicine visit.   Location of patient (ex: home, work):  Home  Patient consents to a telephone visit:  Yes  Location of the provider (ex: office, home):  Office [place].   Name of any referring provider:  na  Names of all persons participating in the telemedicine service and their role in the encounter:  [name] patient, Synetta Fail Lyllian Gause, CMA, Abbey Chatters, NP  Time spent on call:  7:10

## 2023-10-14 NOTE — Telephone Encounter (Signed)
 Mr. Randy Pratt, Randy Pratt are scheduled for a virtual visit with your provider today.    Just as we do with appointments in the office, we must obtain your consent to participate.  Your consent will be active for this visit and any virtual visit you Randy Pratt have with one of our providers in the next 365 days.    If you have a MyChart account, I can also send a copy of this consent to you electronically.  All virtual visits are billed to your insurance company just like a traditional visit in the office.  As this is a virtual visit, video technology does not allow for your provider to perform a traditional examination.  This Randy Pratt limit your provider's ability to fully assess your condition.  If your provider identifies any concerns that need to be evaluated in person or the need to arrange testing such as labs, EKG, etc, we will make arrangements to do so.    Although advances in technology are sophisticated, we cannot ensure that it will always work on either your end or our end.  If the connection with a video visit is poor, we Randy Pratt have to switch to a telephone visit.  With either a video or telephone visit, we are not always able to ensure that we have a secure connection.   I need to obtain your verbal consent now.   Are you willing to proceed with your visit today?   Randy Pratt has provided verbal consent on 10/14/2023 for a virtual visit (video or telephone).   Randy Pratt, [place] 10/14/2023  11:10 AM

## 2023-10-15 ENCOUNTER — Other Ambulatory Visit: Payer: Self-pay

## 2023-10-15 DIAGNOSIS — R569 Unspecified convulsions: Secondary | ICD-10-CM

## 2023-10-15 MED ORDER — LEVETIRACETAM 750 MG PO TABS: 1500.0000 mg | ORAL_TABLET | Freq: Two times a day (BID) | ORAL | 0 refills | Status: DC

## 2023-10-15 NOTE — Telephone Encounter (Signed)
 Patient called to say Keppra was to be sent to the pharmacy yesterday and it was not sent. It appears order class was sent to print versus normal, I have corrected this action and submitted rx as requested

## 2023-10-20 ENCOUNTER — Telehealth: Payer: Self-pay

## 2023-10-20 NOTE — Progress Notes (Signed)
  Medicaid Managed Care   Unsuccessful Attempt Note   10/20/2023 Name: Randy Pratt MRN: [id number] DOB: Jan 06, 1981  Referred by: Gillis Santa, NP Reason for referral : High Risk Managed Medicaid (MM Initial Outreach for SDOH Needs. )   An unsuccessful telephone outreach was attempted today. The patient was referred to the case management team for assistance with care management and care coordination.    Follow Up Plan: A HIPAA compliant phone message was left for the patient providing contact information and requesting a return call.   [facility]  [facility], [facility] Health Care Management Assistant Direct Dial: [phone]  Fax: [phone]

## 2023-10-22 ENCOUNTER — Telehealth: Payer: Self-pay

## 2023-10-22 NOTE — Progress Notes (Signed)
  Medicaid Managed Care   Unsuccessful Attempt Note   10/22/2023 Name: Randy Pratt MRN: [id number] DOB: 1980-11-13  Referred by: Gillis Santa, NP Reason for referral : High Risk Managed Medicaid (Initial outreach for SDOH needs. )   A second unsuccessful telephone outreach was attempted today. The patient was referred to the case management team for assistance with care management and care coordination.    Follow Up Plan: A HIPAA compliant phone message was left for the patient providing contact information and requesting a return call.    [facility]  [facility], [facility] Health Care Management Assistant Direct Dial: [phone]  Fax: [phone]

## 2023-10-27 ENCOUNTER — Encounter: Payer: Self-pay | Admitting: Adult Health

## 2023-10-27 ENCOUNTER — Telehealth: Payer: Self-pay

## 2023-10-27 ENCOUNTER — Ambulatory Visit (INDEPENDENT_AMBULATORY_CARE_PROVIDER_SITE_OTHER): Payer: MEDICAID | Admitting: Adult Health

## 2023-10-27 VITALS — BP 132/78 | HR 95 | Temp 97.3°F | Resp 18 | Ht 73.0 in | Wt 180.0 lb

## 2023-10-27 DIAGNOSIS — R569 Unspecified convulsions: Secondary | ICD-10-CM

## 2023-10-27 DIAGNOSIS — R7303 Prediabetes: Secondary | ICD-10-CM | POA: Diagnosis not present

## 2023-10-27 NOTE — Progress Notes (Signed)
  Medicaid Managed Care   Unsuccessful Attempt Note   10/27/2023 Name: Randy Pratt MRN: [id number] DOB: January 19, 1981  Referred by: Gillis Santa, NP Reason for referral : High Risk Managed Medicaid (Initial Outreach for SDOH needs. )   Third unsuccessful telephone outreach was attempted today. The patient was referred to the case management team for assistance with care management and care coordination. The patient's primary care provider has been notified of our unsuccessful attempts to make or maintain contact with the patient. The care management team is pleased to engage with this patient at any time in the future should he/she be interested in assistance from the care management team.    Follow Up Plan: A HIPAA compliant phone message was left for the patient providing contact information and requesting a return call.    [facility]  [facility], [facility] Care Management Assistant Direct Dial: [phone]  Fax: [phone]

## 2023-10-27 NOTE — Progress Notes (Unsigned)
 [facility] clinic  Provider:  Kenard Gower DNP  Code Status: Full Code  Goals of Care:     12/20/2022    3:31 PM  Advanced Directives  Does Patient Have a Medical Advance Directive? No  Would patient like information on creating a medical advance directive? No - Patient declined     Chief Complaint  Patient presents with  . Medical Management of Chronic Issues    6 months follow up    Discussed the use of AI scribe software for clinical note transcription with the patient, who gave verbal consent to proceed.  HPI: Patient is a 43 y.o. male seen today for an acute visit for       Past Medical History:  Diagnosis Date  . Seizures (HCC)   . Traumatic brain injury [facility])     Past Surgical History:  Procedure Laterality Date  . CRANIOTOMY    . HAND SURGERY  08/19/2010   L wrist, cut 8 tendons, 1 nerve and main artery    No Known Allergies  Outpatient Encounter Medications as of 10/27/2023  Medication Sig  . acetaminophen (TYLENOL) 500 MG tablet Take 2 tablets (1,000 mg total) by mouth every 8 (eight) hours as needed.  . levETIRAcetam (KEPPRA) 750 MG tablet Take 2 tablets (1,500 mg total) by mouth 2 (two) times daily.  Marland Kitchen gabapentin (NEURONTIN) 100 MG capsule Take 1 capsule (100 mg total) by mouth 2 (two) times daily. (Patient not taking: Reported on 10/27/2023)  . [DISCONTINUED] cetirizine (ZYRTEC ALLERGY) 10 MG tablet Take 1 tablet (10 mg total) by mouth daily.  . [DISCONTINUED] fluticasone (FLONASE) 50 MCG/ACT nasal spray Place 1 spray into both nostrils daily for 14 days.   No facility-administered encounter medications on file as of 10/27/2023.    Review of Systems:  Review of Systems  Constitutional:  Negative for activity change, appetite change and fever.  HENT:  Negative for sore throat.   Eyes: Negative.   Cardiovascular:  Negative for chest pain and leg swelling.  Gastrointestinal:  Negative for abdominal distention, diarrhea and vomiting.   Genitourinary:  Negative for dysuria, frequency and urgency.  Skin:  Negative for color change.  Neurological:  Negative for dizziness and headaches.  Psychiatric/Behavioral:  Negative for behavioral problems and sleep disturbance. The patient is not nervous/anxious.     Health Maintenance  Topic Date Due  . Pneumococcal Vaccine 41-67 Years old (1 of 2 - PCV) 04/28/2024 (Originally 11/29/1987)  . INFLUENZA VACCINE  04/28/2024 (Originally 03/20/2023)  . COVID-19 Vaccine (1 - 2024-25 season) 04/28/2024 (Originally 04/20/2023)  . Hepatitis C Screening  Completed  . HIV Screening  Completed  . HPV VACCINES  Aged Out  . DTaP/Tdap/Td  Discontinued    Physical Exam: Vitals:   10/27/23 1451  BP: 132/78  Pulse: 95  Resp: 18  Temp: (!) 97.3 F (36.3 C)  SpO2: 97%  Weight: 180 lb (81.6 kg)  Height: 6\' 1"  (1.854 m)   Body mass index is 23.75 kg/m. Physical Exam Constitutional:      Appearance: Normal appearance.  HENT:     Head: Normocephalic and atraumatic.     Mouth/Throat:     Mouth: Mucous membranes are moist.  Eyes:     Conjunctiva/sclera: Conjunctivae normal.  Cardiovascular:     Rate and Rhythm: Normal rate and regular rhythm.     Pulses: Normal pulses.     Heart sounds: Normal heart sounds.  Pulmonary:     Effort: Pulmonary effort is normal.  Breath sounds: Normal breath sounds.  Abdominal:     General: Bowel sounds are normal.     Palpations: Abdomen is soft.  Musculoskeletal:        General: No swelling. Normal range of motion.     Cervical back: Normal range of motion.  Skin:    General: Skin is warm and dry.  Neurological:     General: No focal deficit present.     Mental Status: He is alert and oriented to person, place, and time.  Psychiatric:        Mood and Affect: Mood normal.        Behavior: Behavior normal.        Thought Content: Thought content normal.        Judgment: Judgment normal.    Labs reviewed: Basic Metabolic Panel: Recent Labs     12/20/22 1611 06/18/23 2007  NA 140 139  K 4.2 3.6  CL 104 107  CO2 27 23  GLUCOSE 92 107*  BUN 11 14  CREATININE 0.99 1.09  CALCIUM 10.5* 9.1   Liver Function Tests: Recent Labs    12/20/22 1611 06/18/23 2007  AST 12 39  ALT 16 42  ALKPHOS  --  65  BILITOT 0.5 0.7  PROT 7.0 6.9  ALBUMIN  --  4.4   No results for input(s): "LIPASE", "AMYLASE" in the last 8760 hours. No results for input(s): "AMMONIA" in the last 8760 hours. CBC: Recent Labs    12/20/22 1611 06/18/23 2007  WBC 8.2 14.7*  NEUTROABS 5,420 11.8*  HGB 17.3* 14.9  HCT 52.6* 41.8  MCV 84.7 80.9  PLT 306 229   Lipid Panel: Recent Labs    12/20/22 1611  CHOL 174  HDL 66  LDLCALC 88  TRIG 107  CHOLHDL 2.6   Lab Results  Component Value Date   HGBA1C 5.7 (H) 12/20/2022    Procedures since last visit: No results found.  Assessment/Plan Assessment and Plan                Labs/tests ordered:  * No order type specified *   No follow-ups on file.  Kenard Gower, NP

## 2023-10-28 LAB — CBC WITH DIFFERENTIAL/PLATELET
Absolute Lymphocytes: 1465 {cells}/uL (ref 850–3900)
Absolute Monocytes: 416 {cells}/uL (ref 200–950)
Basophils Absolute: 53 {cells}/uL (ref 0–200)
Basophils Relative: 0.8 %
Eosinophils Absolute: 92 {cells}/uL (ref 15–500)
Eosinophils Relative: 1.4 %
HCT: 54.1 % — ABNORMAL HIGH (ref 38.5–50.0)
Hemoglobin: 17.5 g/dL — ABNORMAL HIGH (ref 13.2–17.1)
MCH: 27.6 pg (ref 27.0–33.0)
MCHC: 32.3 g/dL (ref 32.0–36.0)
MCV: 85.2 fL (ref 80.0–100.0)
MPV: 10.8 fL (ref 7.5–12.5)
Monocytes Relative: 6.3 %
Neutro Abs: 4574 {cells}/uL (ref 1500–7800)
Neutrophils Relative %: 69.3 %
Platelets: 288 10*3/uL (ref 140–400)
RBC: 6.35 10*6/uL — ABNORMAL HIGH (ref 4.20–5.80)
RDW: 13 % (ref 11.0–15.0)
Total Lymphocyte: 22.2 %
WBC: 6.6 10*3/uL (ref 3.8–10.8)

## 2023-10-28 LAB — COMPLETE METABOLIC PANEL WITH GFR
AG Ratio: 2.1 (calc) (ref 1.0–2.5)
ALT: 12 U/L (ref 9–46)
AST: 15 U/L (ref 10–40)
Albumin: 4.8 g/dL (ref 3.6–5.1)
Alkaline phosphatase (APISO): 71 U/L (ref 36–130)
BUN: 11 mg/dL (ref 7–25)
CO2: 28 mmol/L (ref 20–32)
Calcium: 9.9 mg/dL (ref 8.6–10.3)
Chloride: 103 mmol/L (ref 98–110)
Creat: 0.89 mg/dL (ref 0.60–1.29)
Globulin: 2.3 g/dL (ref 1.9–3.7)
Glucose, Bld: 105 mg/dL — ABNORMAL HIGH (ref 65–99)
Potassium: 4.6 mmol/L (ref 3.5–5.3)
Sodium: 140 mmol/L (ref 135–146)
Total Bilirubin: 0.4 mg/dL (ref 0.2–1.2)
Total Protein: 7.1 g/dL (ref 6.1–8.1)
eGFR: 110 mL/min/{1.73_m2} (ref >=60)

## 2023-10-30 ENCOUNTER — Other Ambulatory Visit: Payer: Self-pay | Admitting: Nurse Practitioner

## 2023-10-30 ENCOUNTER — Other Ambulatory Visit: Payer: Self-pay | Admitting: Adult Health

## 2023-10-30 ENCOUNTER — Encounter: Payer: Self-pay | Admitting: Adult Health

## 2023-10-30 DIAGNOSIS — R569 Unspecified convulsions: Secondary | ICD-10-CM

## 2023-10-30 MED ORDER — LEVETIRACETAM 750 MG PO TABS: 1500.0000 mg | ORAL_TABLET | Freq: Two times a day (BID) | ORAL | 0 refills | Status: DC

## 2023-10-30 NOTE — Telephone Encounter (Signed)
Message routed to PCP Medina-Vargas, Monina C, NP  

## 2023-10-30 NOTE — Telephone Encounter (Signed)
 Sent eRX for Keppra.

## 2023-10-30 NOTE — Telephone Encounter (Signed)
 MyChart message sent to patient.

## 2023-11-04 NOTE — Progress Notes (Signed)
-    no anemia -  electrolytes, liver enzymes normal -  pls schedule lab appointment for Keppra level -  pls schedule your appointment with [facility], they have called X1.

## 2024-01-19 ENCOUNTER — Other Ambulatory Visit: Payer: Self-pay | Admitting: Adult Health

## 2024-01-19 DIAGNOSIS — R569 Unspecified convulsions: Secondary | ICD-10-CM

## 2024-01-29 ENCOUNTER — Encounter: Payer: Self-pay | Admitting: Adult Health

## 2024-01-29 ENCOUNTER — Ambulatory Visit: Payer: MEDICAID | Admitting: Adult Health

## 2024-01-29 VITALS — BP 125/75 | HR 93 | Temp 98.2°F | Resp 18 | Ht 73.0 in | Wt 186.6 lb

## 2024-01-29 DIAGNOSIS — R7303 Prediabetes: Secondary | ICD-10-CM | POA: Diagnosis not present

## 2024-01-29 DIAGNOSIS — R569 Unspecified convulsions: Secondary | ICD-10-CM

## 2024-01-29 DIAGNOSIS — M542 Cervicalgia: Secondary | ICD-10-CM

## 2024-01-29 LAB — HEMOGLOBIN A1C
Hgb A1c MFr Bld: 5.7 % — ABNORMAL HIGH (ref <5.7)
Mean Plasma Glucose: 117 mg/dL
eAG (mmol/L): 6.5 mmol/L

## 2024-01-29 MED ORDER — GABAPENTIN 100 MG PO CAPS: 100.0000 mg | ORAL_CAPSULE | Freq: Two times a day (BID) | ORAL | 3 refills | Status: DC

## 2024-01-29 NOTE — Progress Notes (Signed)
 [facility] clinic  Provider:  Inge Mangle DNP  Code Status:  Full Code  Goals of Care:     12/20/2022    3:31 PM  Advanced Directives  Does Patient Have a Medical Advance Directive? No  Would patient like information on creating a medical advance directive? No - Patient declined     Chief Complaint  Patient presents with   Medical Management of Chronic Issues    3 month f/u    Discussed the use of AI scribe software for clinical note transcription with the patient, who gave verbal consent to proceed.  HPI: Patient is a 43 y.o. male seen today for a 105-month follow up of chronic medical issues. He was accompanied by his wife.  He experiences seizures approximately once a week, with the most recent episode occurring last week. He has not yet consulted a neurologist due to transportation issues and unemployment. He is currently taking Keppra  1500 mg twice daily, which provides partial control but does not completely prevent seizures. He has been without gabapentin  for a couple of months due to difficulties in obtaining the medication.  He is prediabetic and manages his condition through dietary control, without medication. His last A1c was checked a year ago, and he had blood work done last March. He is reluctant to undergo blood work due to discomfort with needles.  He is not currently employed and faces transportation challenges, impacting his ability to attend medical appointments. He has a Child psychotherapist but has not been in contact recently. No falls, depression, or pain are reported, and there is no swelling in his feet.    Past Medical History:  Diagnosis Date   Seizures (HCC)    Traumatic brain injury [facility])     Past Surgical History:  Procedure Laterality Date   CRANIOTOMY     HAND SURGERY  08/19/2010   L wrist, cut 8 tendons, 1 nerve and main artery    No Known Allergies  Outpatient Encounter Medications as of 01/29/2024  Medication Sig   acetaminophen   (TYLENOL ) 500 MG tablet Take 2 tablets (1,000 mg total) by mouth every 8 (eight) hours as needed.   levETIRAcetam  (KEPPRA ) 750 MG tablet TAKE 2 TABLETS(1500 MG) BY MOUTH TWICE DAILY   [DISCONTINUED] gabapentin  (NEURONTIN ) 100 MG capsule Take 1 capsule (100 mg total) by mouth 2 (two) times daily.   gabapentin  (NEURONTIN ) 100 MG capsule Take 1 capsule (100 mg total) by mouth 2 (two) times daily.   [DISCONTINUED] cetirizine  (ZYRTEC  ALLERGY) 10 MG tablet Take 1 tablet (10 mg total) by mouth daily.   [DISCONTINUED] fluticasone  (FLONASE ) 50 MCG/ACT nasal spray Place 1 spray into both nostrils daily for 14 days.   No facility-administered encounter medications on file as of 01/29/2024.    Review of Systems:  Review of Systems  Constitutional:  Negative for activity change, appetite change and fever.  HENT:  Negative for sore throat.   Eyes: Negative.   Cardiovascular:  Negative for chest pain and leg swelling.  Gastrointestinal:  Negative for abdominal distention, diarrhea and vomiting.  Genitourinary:  Negative for dysuria, frequency and urgency.  Skin:  Negative for color change.  Neurological:  Positive for seizures. Negative for dizziness and headaches.  Psychiatric/Behavioral:  Negative for behavioral problems and sleep disturbance. The patient is not nervous/anxious.     Health Maintenance  Topic Date Due   HPV VACCINES (1 - Male 3-dose series) Never done   Pneumococcal Vaccine 72-40 Years old (1 of 2 - PCV) 04/28/2024 (  Originally 11/28/2000)   COVID-19 Vaccine (1 - 2024-25 season) 04/28/2024 (Originally 04/20/2023)   INFLUENZA VACCINE  03/19/2024   Hepatitis C Screening  Completed   HIV Screening  Completed   Meningococcal B Vaccine  Aged Out   DTaP/Tdap/Td  Discontinued    Physical Exam: Vitals:   01/29/24 1525  BP: 125/75  Pulse: 93  Resp: 18  Temp: 98.2 F (36.8 C)  SpO2: 96%  Weight: 186 lb 9.6 oz (84.6 kg)  Height: 6' 1 (1.854 m)   Body mass index is 24.62  kg/m. Physical Exam Constitutional:      Appearance: Normal appearance.  HENT:     Head: Normocephalic and atraumatic.     Mouth/Throat:     Mouth: Mucous membranes are moist.   Eyes:     Conjunctiva/sclera: Conjunctivae normal.    Cardiovascular:     Rate and Rhythm: Normal rate and regular rhythm.     Pulses: Normal pulses.     Heart sounds: Normal heart sounds.  Pulmonary:     Effort: Pulmonary effort is normal.     Breath sounds: Normal breath sounds.  Abdominal:     General: Bowel sounds are normal.     Palpations: Abdomen is soft.   Musculoskeletal:        General: No swelling. Normal range of motion.     Cervical back: Normal range of motion.   Skin:    General: Skin is warm and dry.   Neurological:     General: No focal deficit present.     Mental Status: He is alert and oriented to person, place, and time.   Psychiatric:        Mood and Affect: Mood normal.        Behavior: Behavior normal.        Thought Content: Thought content normal.        Judgment: Judgment normal.     Labs reviewed: Basic Metabolic Panel: Recent Labs    06/18/23 2007 10/27/23 1534  NA 139 140  K 3.6 4.6  CL 107 103  CO2 23 28  GLUCOSE 107* 105*  BUN 14 11  CREATININE 1.09 0.89  CALCIUM 9.1 9.9   Liver Function Tests: Recent Labs    06/18/23 2007 10/27/23 1534  AST 39 15  ALT 42 12  ALKPHOS 65  --   BILITOT 0.7 0.4  PROT 6.9 7.1  ALBUMIN 4.4  --    No results for input(s): LIPASE, AMYLASE in the last 8760 hours. No results for input(s): AMMONIA in the last 8760 hours. CBC: Recent Labs    06/18/23 2007 10/27/23 1534  WBC 14.7* 6.6  NEUTROABS 11.8* 4,574  HGB 14.9 17.5*  HCT 41.8 54.1*  MCV 80.9 85.2  PLT 229 288   Lipid Panel: No results for input(s): CHOL, HDL, LDLCALC, TRIG, CHOLHDL, LDLDIRECT in the last 8760 hours. Lab Results  Component Value Date   HGBA1C 5.7 (H) 01/29/2024    Procedures since last visit: No results  found.  Assessment/Plan  Seizure Disorder Experiences weekly seizures despite Keppra  1500 mg BID. Lapsed gabapentin  use due to medication unavailability. Neurologist consultation pending due to personal and logistical issues. Discussed seizure risks with inconsistent medication and need for neurologist input. - Send gabapentin  prescription to [facility] with 60 tablets per month and 3 refills. - Emphasized consistent medication adherence. - Encouraged neurologist follow-up.  Prediabetes Managing with diet. Last A1c a year ago. Discussed regular monitoring to prevent diabetes progression. - Order A1c  test.  General Health Maintenance Blood pressure 125/75 mmHg. No depression reported. - Encouraged regular primary care follow-up.  Follow-up Advised follow-up in three months for condition management. - Schedule follow-up appointment in three months.     Labs/tests ordered:   A1C   Return in about 3 months (around 04/30/2024).  Alpheus Stiff Medina-Vargas, NP

## 2024-01-30 ENCOUNTER — Ambulatory Visit: Payer: Self-pay | Admitting: Adult Health

## 2024-01-30 NOTE — Progress Notes (Signed)
-    A1C 5.7, same as before

## 2024-02-02 DIAGNOSIS — R7303 Prediabetes: Secondary | ICD-10-CM | POA: Insufficient documentation

## 2024-02-02 NOTE — Assessment & Plan Note (Signed)
 On Keppra  750 mg takes 2 tabs = 1500 mg BID and Gabapentin  100 mg BID

## 2024-02-20 ENCOUNTER — Other Ambulatory Visit: Payer: Self-pay | Admitting: Adult Health

## 2024-02-20 DIAGNOSIS — R569 Unspecified convulsions: Secondary | ICD-10-CM

## 2024-03-27 ENCOUNTER — Other Ambulatory Visit: Payer: Self-pay | Admitting: Adult Health

## 2024-03-27 DIAGNOSIS — R569 Unspecified convulsions: Secondary | ICD-10-CM

## 2024-03-29 ENCOUNTER — Telehealth: Payer: Self-pay | Admitting: Adult Health

## 2024-03-29 DIAGNOSIS — R569 Unspecified convulsions: Secondary | ICD-10-CM

## 2024-03-30 ENCOUNTER — Encounter: Payer: Self-pay | Admitting: Adult Health

## 2024-03-30 MED ORDER — LEVETIRACETAM 750 MG PO TABS: 750.0000 mg | ORAL_TABLET | Freq: Two times a day (BID) | ORAL | 0 refills | Status: DC

## 2024-03-30 NOTE — Telephone Encounter (Signed)
 Copied from CRM [id number]. Topic: Clinical - Medication Refill >> Mar 29, 2024  3:48 PM Suzette B wrote: Medication: levETIRAcetam  (KEPPRA ) 750 MG tablet  Has the patient contacted their pharmacy? Yes: they advised him that it had not been approved as of yet This is the patient's preferred pharmacy:  [id number] - [place], [place] - [address] AT [facility] OF [address] & [address] [place]  [address] Phone: [phone] Fax: [phone]   Is this the correct pharmacy for this prescription? Yes If no, delete pharmacy and type the correct one.   Has the prescription been filled recently? Yes  Is the patient out of the medication? Yes  Has the patient been seen for an appointment in the last year OR does the patient have an upcoming appointment? Yes  Can we respond through MyChart? Yes  Agent: Please be advised that Rx refills may take up to 3 business days. We ask that you follow-up with your pharmacy.

## 2024-03-30 NOTE — Telephone Encounter (Signed)
 This encounter was created in error - please disregard.

## 2024-04-27 ENCOUNTER — Other Ambulatory Visit: Payer: Self-pay | Admitting: Adult Health

## 2024-04-27 DIAGNOSIS — R569 Unspecified convulsions: Secondary | ICD-10-CM

## 2024-04-29 ENCOUNTER — Encounter: Payer: Self-pay | Admitting: Adult Health

## 2024-04-29 ENCOUNTER — Other Ambulatory Visit: Payer: Self-pay | Admitting: Adult Health

## 2024-04-29 ENCOUNTER — Telehealth: Payer: Self-pay | Admitting: Pharmacist

## 2024-04-29 DIAGNOSIS — R569 Unspecified convulsions: Secondary | ICD-10-CM

## 2024-04-29 MED ORDER — LEVETIRACETAM 750 MG PO TABS
1500.0000 mg | ORAL_TABLET | Freq: Two times a day (BID) | ORAL | 1 refills | Status: AC
Start: 2024-04-29 — End: ?

## 2024-04-29 MED ORDER — LEVETIRACETAM 750 MG PO TABS: 750.0000 mg | ORAL_TABLET | Freq: Two times a day (BID) | ORAL | 1 refills | Status: DC

## 2024-04-29 NOTE — Telephone Encounter (Signed)
 Sent eRx of Keppra  to pharmacy.

## 2024-04-29 NOTE — Telephone Encounter (Signed)
 Monina please see Katina's message and confirm what dose and instructions patient should be on. I have already submitted another rx earlier today according to what was on his medication list. If patient should be on a different dose we need to update rx and resubmit.  Jolee Cassius PARAS, RPH to Me  Medina-Vargas, Monina C, NP  (Selected Message)     04/29/24  1:43 PM Looks like the issue is with the instructions. On the last prescription, the instructions are 1 tablet twice daily.  Patient had been taking 2 tablets twice daily. Since the quantity was the same, he probably did not pay any attention to the instructions. The pharmacy filled the prescription for 120 tablets for a 60 day supply which was correct given the  way the prescription was sent to them.   However, he has been taking 2 tablets twice a day which would explain why he ran out and needs a refill.  The insurance is basing their refill too soon rejection on the way the prescription was sent and filled.   A new prescription needs to be sent with the correct instructions. Patient also wants to get a 90 day supply.    The Pharmacy will have to put an override code in.   I can enter a new script and send it over cosignature required if you would like.   Blessings,   Cassius DOROTHA Jolee, PharmD, BCACP Clinical Pharmacist [phone]

## 2024-04-29 NOTE — Progress Notes (Signed)
   04/29/2024  Patient ID: Randy Pratt, male   DOB: 08-02-1981, 43 y.o.   MRN: [id number]  Received message from [facility], who works with Provider, Medina-Vargas that the Patient was concerned about his Levetiracetam  prescription. He was being told by his pharmacy that the prescription was too early to be filled and he was completely out of medication.  After speaking with a representative at [facility], it became clear that in July 120 Levetiracetam  tablets were filled for a 30 day supply but on 03/30/24, it was filled for a 60 day supply. Upon investigation of each prescription, it became clear that the prescription from July was for Levetiracetam  750 mg 2 tablets twice daily and the prescription that was sent in August was for Levetiracetam  750 mg 1 tablet twice daily.  Patient confirmed today that he has been taking Levetiracetam  750 mg twice daily. He also requested a 90 day supply to be sent to his pharmacy.  A request was sent back to the Provider--awaiting reply.  If deemed therapeutically appropriate, I can send over a new script with the correct instructions for a 90 day supply along with a note to the pharmacy altering them to the change in instructions and the possible need for override.   Plan: Will await a response from the Provider.    Cassius DOROTHA Brought, PharmD, BCACP Clinical Pharmacist [phone]

## 2024-04-29 NOTE — Addendum Note (Signed)
 Addended by: SUELLEN DEVIN BROCKS on: 04/29/2024 12:08 PM   Modules accepted: Orders

## 2024-04-29 NOTE — Telephone Encounter (Signed)
 Noted that eRx was sent.

## 2024-04-29 NOTE — Telephone Encounter (Signed)
 Monina please advise

## 2024-04-29 NOTE — Telephone Encounter (Signed)
 Cassius can you advise if there is anything else we can do to assist patient

## 2024-04-29 NOTE — Telephone Encounter (Signed)
 Sent eRx for Keppra  750 mg take 2 tabs = 1500 mg BID to pharmacy

## 2024-04-30 ENCOUNTER — Ambulatory Visit: Payer: MEDICAID | Admitting: Adult Health

## 2024-04-30 ENCOUNTER — Other Ambulatory Visit: Payer: Self-pay | Admitting: Adult Health

## 2024-04-30 ENCOUNTER — Encounter: Payer: Self-pay | Admitting: Adult Health

## 2024-04-30 VITALS — BP 126/88 | HR 93 | Temp 98.1°F | Resp 18 | Ht 73.0 in | Wt 199.8 lb

## 2024-04-30 DIAGNOSIS — R569 Unspecified convulsions: Secondary | ICD-10-CM

## 2024-04-30 DIAGNOSIS — R7303 Prediabetes: Secondary | ICD-10-CM

## 2024-04-30 NOTE — Progress Notes (Signed)
 [facility] clinic  Provider:  Jereld Serum DNP  Code Status:  Full Code  Goals of Care:     12/20/2022    3:31 PM  Advanced Directives  Does Patient Have a Medical Advance Directive? No  Would patient like information on creating a medical advance directive? No - Patient declined     Chief Complaint  Patient presents with   Medical Management of Chronic Issues    3 months     Discussed the use of AI scribe software for clinical note transcription with the patient, who gave verbal consent to proceed.  HPI: Patient is a 43 y.o. male seen today for a 82-month follow up of chronic medical issues.  He experienced a mild seizure last Tuesday , 04/27/24, without loss of consciousness or incontinence. Last month, he had a more severe seizure with loss of consciousness. He is currently taking Keppra  1500 mg twice daily and gabapentin  100 mg twice daily. He recently obtained his medication and is managing his condition with these medications. Transportation issues are a barrier to scheduling a neurology appointment.  He is prediabetic, with a previous A1c of 5.7% recorded in June. He engages in physical activity by playing soccer with his 36 year old son. His diet is high in carbohydrates, particularly pasta. He has gained weight since his last visit, now weighing 199 pounds, up from 186 pounds in June.  He has declined flu, pneumonia, COVID, and HPV vaccines, stating a preference against vaccinations.   Past Medical History:  Diagnosis Date   Seizures (HCC)    Traumatic brain injury [facility])     Past Surgical History:  Procedure Laterality Date   CRANIOTOMY     HAND SURGERY  08/19/2010   L wrist, cut 8 tendons, 1 nerve and main artery    No Known Allergies  Outpatient Encounter Medications as of 04/30/2024  Medication Sig   acetaminophen  (TYLENOL ) 500 MG tablet Take 2 tablets (1,000 mg total) by mouth every 8 (eight) hours as needed.   gabapentin  (NEURONTIN ) 100 MG capsule TAKE  1 CAPSULE(100 MG) BY MOUTH TWICE DAILY   levETIRAcetam  (KEPPRA ) 750 MG tablet Take 2 tablets (1,500 mg total) by mouth 2 (two) times daily.   [DISCONTINUED] cetirizine  (ZYRTEC  ALLERGY) 10 MG tablet Take 1 tablet (10 mg total) by mouth daily.   [DISCONTINUED] fluticasone  (FLONASE ) 50 MCG/ACT nasal spray Place 1 spray into both nostrils daily for 14 days.   No facility-administered encounter medications on file as of 04/30/2024.    Review of Systems:  Review of Systems  Constitutional:  Negative for activity change, appetite change and fever.  HENT:  Negative for sore throat.   Eyes: Negative.   Cardiovascular:  Negative for chest pain and leg swelling.  Gastrointestinal:  Negative for abdominal distention, diarrhea and vomiting.  Genitourinary:  Negative for dysuria, frequency and urgency.  Skin:  Negative for color change.  Neurological:  Positive for seizures. Negative for dizziness and headaches.  Psychiatric/Behavioral:  Negative for behavioral problems and sleep disturbance. The patient is not nervous/anxious.     Health Maintenance  Topic Date Due   Hepatitis B Vaccines 19-59 Average Risk  Completed   Hepatitis C Screening  Completed   HIV Screening  Completed   Meningococcal B Vaccine  Aged Out   DTaP/Tdap/Td  Discontinued   Pneumococcal Vaccine  Discontinued   Influenza Vaccine  Discontinued   HPV VACCINES  Discontinued   COVID-19 Vaccine  Discontinued    Physical Exam: Vitals:   04/30/24  1431  BP: 126/88  Pulse: 93  Resp: 18  Temp: 98.1 F (36.7 C)  SpO2: 98%  Weight: 199 lb 12.8 oz (90.6 kg)  Height: 6' 1 (1.854 m)   Body mass index is 26.36 kg/m. Physical Exam Constitutional:      Appearance: Normal appearance.  HENT:     Head: Normocephalic and atraumatic.     Mouth/Throat:     Mouth: Mucous membranes are moist.  Eyes:     Conjunctiva/sclera: Conjunctivae normal.  Cardiovascular:     Rate and Rhythm: Normal rate and regular rhythm.     Pulses:  Normal pulses.     Heart sounds: Normal heart sounds.  Pulmonary:     Effort: Pulmonary effort is normal.     Breath sounds: Normal breath sounds.  Abdominal:     General: Bowel sounds are normal.     Palpations: Abdomen is soft.  Musculoskeletal:        General: No swelling. Normal range of motion.     Cervical back: Normal range of motion.  Skin:    General: Skin is warm and dry.  Neurological:     General: No focal deficit present.     Mental Status: He is alert and oriented to person, place, and time.  Psychiatric:        Mood and Affect: Mood normal.        Behavior: Behavior normal.        Thought Content: Thought content normal.        Judgment: Judgment normal.     Labs reviewed: Basic Metabolic Panel: Recent Labs    06/18/23 2007 10/27/23 1534  NA 139 140  K 3.6 4.6  CL 107 103  CO2 23 28  GLUCOSE 107* 105*  BUN 14 11  CREATININE 1.09 0.89  CALCIUM 9.1 9.9   Liver Function Tests: Recent Labs    06/18/23 2007 10/27/23 1534  AST 39 15  ALT 42 12  ALKPHOS 65  --   BILITOT 0.7 0.4  PROT 6.9 7.1  ALBUMIN 4.4  --    No results for input(s): LIPASE, AMYLASE in the last 8760 hours. No results for input(s): AMMONIA in the last 8760 hours. CBC: Recent Labs    06/18/23 2007 10/27/23 1534 04/30/24 1423  WBC 14.7* 6.6 9.0  NEUTROABS 11.8* 4,574 6,066  HGB 14.9 17.5* 17.7*  HCT 41.8 54.1* 53.0*  MCV 80.9 85.2 89.1  PLT 229 288 300   Lipid Panel: No results for input(s): CHOL, HDL, LDLCALC, TRIG, CHOLHDL, LDLDIRECT in the last 8760 hours. Lab Results  Component Value Date   HGBA1C 5.7 (H) 01/29/2024    Procedures since last visit: No results found.  Assessment/Plan  1. Seizure (HCC) (Primary) -  Recent mild seizure without loss of consciousness. Severe seizure last month with loss of consciousness. Neurology follow-up pending due to transportation issues. - Continue Keppra  1500 mg twice daily. - Continue gabapentin  100 mg  twice daily. - Ensure neurology appointment is scheduled. - Follow up in 5 months or sooner if seizures worsen. - CBC with Differential/Platelets - Basic Metabolic Panel - Levetiracetam  level - Levetiracetam  level  2. Prediabetes -  A1c 5.7% in June, within prediabetic range. Engages in regular physical activity. Diet high in carbohydrates. - Encourage continued physical activity.  General Health Maintenance Declines all vaccinations. Weight increased from 186 lbs to 199 lbs. - Respect decision to decline vaccinations.    Labs/tests ordered:  keppra  level, BMP, CBC  Return in about 5 months (around 09/30/2024).  Acelynn Dejonge Medina-Vargas, NP

## 2024-05-04 ENCOUNTER — Ambulatory Visit: Payer: Self-pay | Admitting: Adult Health

## 2024-05-04 NOTE — Progress Notes (Signed)
-    K+ slightly elevated, pls avoid eating foods high in K+ such as banana, spinach, potato, sweet potato, etc -  hgb slightly elevated, possibly due to dehydration, please drink at least 8 glasses of water daily

## 2024-05-06 LAB — CBC WITH DIFFERENTIAL/PLATELET
Absolute Lymphocytes: 2079 {cells}/uL (ref 850–3900)
Absolute Monocytes: 567 {cells}/uL (ref 200–950)
Basophils Absolute: 90 {cells}/uL (ref 0–200)
Basophils Relative: 1 %
Eosinophils Absolute: 198 {cells}/uL (ref 15–500)
Eosinophils Relative: 2.2 %
HCT: 53 % — ABNORMAL HIGH (ref 38.5–50.0)
Hemoglobin: 17.7 g/dL — ABNORMAL HIGH (ref 13.2–17.1)
MCH: 29.7 pg (ref 27.0–33.0)
MCHC: 33.4 g/dL (ref 32.0–36.0)
MCV: 89.1 fL (ref 80.0–100.0)
MPV: 10.1 fL (ref 7.5–12.5)
Monocytes Relative: 6.3 %
Neutro Abs: 6066 {cells}/uL (ref 1500–7800)
Neutrophils Relative %: 67.4 %
Platelets: 300 Thousand/uL (ref 140–400)
RBC: 5.95 Million/uL — ABNORMAL HIGH (ref 4.20–5.80)
RDW: 13.1 % (ref 11.0–15.0)
Total Lymphocyte: 23.1 %
WBC: 9 Thousand/uL (ref 3.8–10.8)

## 2024-05-06 LAB — BASIC METABOLIC PANEL WITH GFR
BUN: 11 mg/dL (ref 7–25)
CO2: 25 mmol/L (ref 20–32)
Calcium: 10.1 mg/dL (ref 8.6–10.3)
Chloride: 101 mmol/L (ref 98–110)
Creat: 1.15 mg/dL (ref 0.60–1.29)
Glucose, Bld: 105 mg/dL (ref 65–139)
Potassium: 5.5 mmol/L — ABNORMAL HIGH (ref 3.5–5.3)
Sodium: 136 mmol/L (ref 135–146)
eGFR: 81 mL/min/1.73m2 (ref >=60)

## 2024-05-06 LAB — LEVETIRACETAM LEVEL: Keppra (Levetiracetam): 30 ug/mL

## 2024-07-22 ENCOUNTER — Telehealth: Payer: Self-pay | Admitting: Adult Health

## 2024-07-22 NOTE — Telephone Encounter (Signed)
 [employer] Medical Records request forward to Medical Records.

## 2024-08-02 NOTE — Telephone Encounter (Signed)
 [employer] Medical Records request forward to Medical Records.  Second Request.

## 2024-09-06 ENCOUNTER — Other Ambulatory Visit: Payer: Self-pay | Admitting: Adult Health

## 2024-09-06 DIAGNOSIS — R569 Unspecified convulsions: Secondary | ICD-10-CM

## 2024-09-30 ENCOUNTER — Ambulatory Visit: Payer: MEDICAID | Admitting: Adult Health
